# Patient Record
Sex: Male | Born: 1955 | Race: White | Hispanic: No | Marital: Married | Smoking: Former smoker
Health system: Southern US, Community
[De-identification: ages and names within clinical notes are randomized; demographics above are authoritative.]

## PROBLEM LIST (undated history)

## (undated) DIAGNOSIS — F32A Depression, unspecified: Secondary | ICD-10-CM

## (undated) DIAGNOSIS — M549 Dorsalgia, unspecified: Secondary | ICD-10-CM

## (undated) DIAGNOSIS — K259 Gastric ulcer, unspecified as acute or chronic, without hemorrhage or perforation: Secondary | ICD-10-CM

## (undated) DIAGNOSIS — Z8619 Personal history of other infectious and parasitic diseases: Secondary | ICD-10-CM

## (undated) DIAGNOSIS — E119 Type 2 diabetes mellitus without complications: Secondary | ICD-10-CM

## (undated) DIAGNOSIS — H269 Unspecified cataract: Secondary | ICD-10-CM

## (undated) DIAGNOSIS — I471 Supraventricular tachycardia, unspecified: Secondary | ICD-10-CM

## (undated) DIAGNOSIS — Z860100 Personal history of colon polyps, unspecified: Secondary | ICD-10-CM

## (undated) DIAGNOSIS — E785 Hyperlipidemia, unspecified: Secondary | ICD-10-CM

## (undated) DIAGNOSIS — J189 Pneumonia, unspecified organism: Secondary | ICD-10-CM

## (undated) DIAGNOSIS — I700 Atherosclerosis of aorta: Secondary | ICD-10-CM

## (undated) DIAGNOSIS — Z8739 Personal history of other diseases of the musculoskeletal system and connective tissue: Secondary | ICD-10-CM

## (undated) DIAGNOSIS — E669 Obesity, unspecified: Secondary | ICD-10-CM

## (undated) DIAGNOSIS — K5790 Diverticulosis of intestine, part unspecified, without perforation or abscess without bleeding: Secondary | ICD-10-CM

## (undated) DIAGNOSIS — M1990 Unspecified osteoarthritis, unspecified site: Secondary | ICD-10-CM

## (undated) DIAGNOSIS — Z8709 Personal history of other diseases of the respiratory system: Secondary | ICD-10-CM

## (undated) DIAGNOSIS — R0789 Other chest pain: Secondary | ICD-10-CM

## (undated) DIAGNOSIS — G5600 Carpal tunnel syndrome, unspecified upper limb: Secondary | ICD-10-CM

## (undated) DIAGNOSIS — G4730 Sleep apnea, unspecified: Secondary | ICD-10-CM

## (undated) DIAGNOSIS — I10 Essential (primary) hypertension: Secondary | ICD-10-CM

## (undated) DIAGNOSIS — G8929 Other chronic pain: Secondary | ICD-10-CM

## (undated) DIAGNOSIS — M25511 Pain in right shoulder: Secondary | ICD-10-CM

## (undated) DIAGNOSIS — F329 Major depressive disorder, single episode, unspecified: Secondary | ICD-10-CM

## (undated) HISTORY — PX: COLONOSCOPY: SHX174

## (undated) HISTORY — DX: Unspecified cataract: H26.9

## (undated) HISTORY — PX: ESOPHAGOGASTRODUODENOSCOPY: SHX1529

## (undated) HISTORY — PX: SHOULDER SURGERY: SHX246

## (undated) HISTORY — DX: Atherosclerosis of aorta: I70.0

## (undated) HISTORY — PX: OTHER SURGICAL HISTORY: SHX169

## (undated) HISTORY — DX: Obesity, unspecified: E66.9

## (undated) HISTORY — DX: Supraventricular tachycardia, unspecified: I47.10

## (undated) HISTORY — DX: Other chest pain: R07.89

## (undated) HISTORY — PX: BACK SURGERY: SHX140

## (undated) HISTORY — PX: APPENDECTOMY: SHX54

## (undated) HISTORY — DX: Pain in right shoulder: M25.511

---

## 1997-08-01 ENCOUNTER — Inpatient Hospital Stay (HOSPITAL_COMMUNITY): Admission: EM | Admit: 1997-08-01 | Discharge: 1997-08-03 | Payer: Self-pay | Admitting: Cardiology

## 1998-05-05 ENCOUNTER — Other Ambulatory Visit: Admission: RE | Admit: 1998-05-05 | Discharge: 1998-05-05 | Payer: Self-pay | Admitting: Family Medicine

## 2000-10-31 ENCOUNTER — Emergency Department (HOSPITAL_COMMUNITY): Admission: EM | Admit: 2000-10-31 | Discharge: 2000-10-31 | Payer: Self-pay | Admitting: Emergency Medicine

## 2000-11-20 ENCOUNTER — Emergency Department (HOSPITAL_COMMUNITY): Admission: EM | Admit: 2000-11-20 | Discharge: 2000-11-20 | Payer: Self-pay | Admitting: Internal Medicine

## 2001-01-12 ENCOUNTER — Emergency Department (HOSPITAL_COMMUNITY): Admission: EM | Admit: 2001-01-12 | Discharge: 2001-01-12 | Payer: Self-pay | Admitting: Emergency Medicine

## 2006-02-20 ENCOUNTER — Encounter (INDEPENDENT_AMBULATORY_CARE_PROVIDER_SITE_OTHER): Payer: Self-pay | Admitting: *Deleted

## 2006-02-20 ENCOUNTER — Observation Stay (HOSPITAL_COMMUNITY): Admission: EM | Admit: 2006-02-20 | Discharge: 2006-02-21 | Payer: Self-pay | Admitting: Emergency Medicine

## 2006-10-14 ENCOUNTER — Emergency Department (HOSPITAL_COMMUNITY): Admission: EM | Admit: 2006-10-14 | Discharge: 2006-10-14 | Payer: Self-pay | Admitting: Emergency Medicine

## 2008-02-13 ENCOUNTER — Ambulatory Visit (HOSPITAL_COMMUNITY): Admission: RE | Admit: 2008-02-13 | Discharge: 2008-02-13 | Payer: Self-pay | Admitting: Orthopedic Surgery

## 2008-02-25 ENCOUNTER — Ambulatory Visit (HOSPITAL_BASED_OUTPATIENT_CLINIC_OR_DEPARTMENT_OTHER): Admission: RE | Admit: 2008-02-25 | Discharge: 2008-02-25 | Payer: Self-pay | Admitting: Family Medicine

## 2008-02-28 ENCOUNTER — Ambulatory Visit: Payer: Self-pay | Admitting: Internal Medicine

## 2008-03-15 ENCOUNTER — Ambulatory Visit (HOSPITAL_BASED_OUTPATIENT_CLINIC_OR_DEPARTMENT_OTHER): Admission: RE | Admit: 2008-03-15 | Discharge: 2008-03-15 | Payer: Self-pay | Admitting: Orthopedic Surgery

## 2008-03-26 ENCOUNTER — Ambulatory Visit (HOSPITAL_BASED_OUTPATIENT_CLINIC_OR_DEPARTMENT_OTHER): Admission: RE | Admit: 2008-03-26 | Discharge: 2008-03-26 | Payer: Self-pay | Admitting: Family Medicine

## 2008-04-03 ENCOUNTER — Ambulatory Visit: Payer: Self-pay | Admitting: Internal Medicine

## 2010-03-25 DIAGNOSIS — J189 Pneumonia, unspecified organism: Secondary | ICD-10-CM

## 2010-03-25 HISTORY — DX: Pneumonia, unspecified organism: J18.9

## 2010-08-02 ENCOUNTER — Inpatient Hospital Stay (INDEPENDENT_AMBULATORY_CARE_PROVIDER_SITE_OTHER)
Admission: RE | Admit: 2010-08-02 | Discharge: 2010-08-02 | Disposition: A | Discharge status: Home / Self Care | Payer: BC Managed Care – PPO | Source: Ambulatory Visit | Attending: Family Medicine | Admitting: Family Medicine

## 2010-08-02 DIAGNOSIS — M79609 Pain in unspecified limb: Secondary | ICD-10-CM

## 2010-08-07 NOTE — Procedures (Signed)
NAME:  [name], [name]                     ACCOUNT NO.:  [id number]   MEDICAL RECORD NO.:  [id number]          PATIENT TYPE:  OUT   LOCATION:  SLEEP CENTER                 FACILITY:  [facility]   PHYSICIAN:  Clinton D. Maple Hudson, MD, FCCP, FACPDATE OF BIRTH:  Aug 08, 1955   DATE OF STUDY:  02/25/2008                            NOCTURNAL POLYSOMNOGRAM   REFERRING PHYSICIAN:  Windle Guard, M.D.   REFERRING PHYSICIAN:  Windle Guard, MD   INDICATION FOR STUDY:  Hypersomnia with sleep apnea.   EPWORTH SLEEPINESS SCORE:  13/24.  BMI 29.  Weight 210 pounds.  Height  71 inches.  Neck 17 inches.   HOME MEDICATIONS:  Charted and reviewed.   SLEEP ARCHITECTURE:  Total sleep time 323 minutes with sleep efficiency  83.2%.  Stage I was 7.1%.  Stage II 80.3%.  Stage III absent.  REM 12.5%  of total sleep time.  Sleep latency 32 minutes.  REM latency 307  minutes.  Awake after sleep onset 33 minutes.  Arousal index 29.7.  No  bedtime medication was taken.   RESPIRATORY DATA:  Apnea-hypopnea index (AHI) 11.5 per hour. A total of  62 events were scored including 7 obstructive apneas and 55 hypopneas.  Events were nonpositional.  REM AHI 53.3 per hour.  There were  insufficient early events to permit CPAP titration by split protocol on  the study night.   OXYGEN DATA:  Moderately loud snoring with oxygen desaturation to a  nadir of 75%.  Mean oxygen saturation through the study was 92.4% on  room air.   CARDIAC DATA:  Normal sinus rhythm.   MOVEMENT/PARASOMNIA:  Occasional limb jerks with a total of 11 counted,  of which 3 were associated with arousal or awakening for a periodic limb  movement with arousal index of 0.6 per hour.  Bathroom x1.   IMPRESSION/RECOMMENDATION:  1. Mild obstructive sleep apnea/hypopnea syndrome, apnea-hypopnea      index 11.5 per hour with nonpositional events, moderately loud      snoring, and oxygen desaturation to a nadir of 75%.  2. There were insufficient early events to  permit continuous positive      airway pressure titration by split protocol on the study night.      Scores in this range would be appropriate for continuous positive      airway pressure if more      conservative measures are not effective.  Consider return for      continuous positive airway pressure titration if indicated.      Clinton D. Maple Hudson, MD, [facility], FACP  Diplomate, Biomedical engineer of Sleep Medicine  Electronically Signed     CDY/MEDQ  D:  02/28/2008 15:18:00  T:  02/29/2008 01:09:09  Job:  [id number]

## 2010-08-07 NOTE — Op Note (Signed)
NAME:  [name], [name]                     ACCOUNT NO.:  [id number]   MEDICAL RECORD NO.:  [id number]          PATIENT TYPE:  AMB   LOCATION:  [facility]                         FACILITY:  [facility]   PHYSICIAN:  Madlyn Frankel. Charlann Boxer, M.D.  DATE OF BIRTH:  October 20, 1955   DATE OF PROCEDURE:  03/15/2008  DATE OF DISCHARGE:                               OPERATIVE REPORT   PREOPERATIVE DIAGNOSIS:  Left knee medial meniscal tear associated with  some minor degenerative changes.   POSTOPERATIVE DIAGNOSES/FINDINGS:  1. Complex tear into the posterior horn to midbody lateral meniscus      tearing.  2. 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  3. Central tearing to the anterior horn and mid body of the lateral      meniscus involving only about 10%.  4. A small area, 5-10 mm of grade II-III to minor changes over the      lateral facet of the patella.   PROCEDURE:  1. Left knee diagnostic and operative arthroscopy.  2. Medial and lateral partial meniscectomies.  3. Medial patellofemoral chondroplasty.   SURGEON:  Madlyn Frankel. Charlann Boxer, M.D.   ASSISTANT:  None.   ANESTHESIA:  General LMA plus locally administered portal block  initially, followed by intraarticular Marcaine at the end of the case.   FINDINGS:  As above.   SPECIMENS:  None.   COMPLICATIONS:  None.   BLOOD LOSS:  None.   INDICATIONS FOR PROCEDURE:  [name] is a 55 year old gentleman who  presented office for left knee injury.  He had medial joint line  symptoms.  MRI had confirmed concerns for meniscal pathology.  He had  failed conservative measures and wished to proceed with arthroscopic  surgery.  The risks of infection, persistence of symptoms, progressive  arthritis, in addition to DVT were all discussed.  Consent was obtained  for the benefit of pain relief.   PROCEDURE IN DETAIL:  The patient was brought to the  operative theater.  Once adequate anesthesia and preoperative antibiotics, Ancef 2 grams  administered, the patient was positioned supine with the left leg in a  leg holder.  A timeout was performed, identifying the patient, extremity  and procedure.   The left lower extremity was then prepped and draped in a sterile  fashion.  Standard inferolateral, inferomedial and superomedial portals  were utilized.  Diagnostic evaluation of the knee revealed the above  findings.  Probe examination to the inferomedial portal evaluated the  stability of the meniscus.  I used a combination of straight biting  basket and a 3.5 Cuda shaver through the inferomedial portal for  debridement of the medial meniscus back to a stable level.  It was a  fairly complex tear.  There was a portion of superior meniscus that was  intact and remained intact.  There was significant degenerative changes  to the inferior portion in a horizontal cleavage tear pattern.  I did  probe examine  the remaining meniscus following this debridement to make  sure that it was stable, and there were no unstable portions or nothing  that migrated to the joint.   Also noted on the distal weightbearing surface of the femoral condyle  was this chondral flap, probably associated meniscal pathology.  The 3.5  shaver was utilized for debridement of this back to stable levels.   The lateral compartment was examined, first examining the integrity of  the ACL which was intact.  The lateral compartment had small tearing to  the central portion of the lateral meniscus.  I used a biting basket and  then a 3.5 Cuda shaver to remove these fragments.  The remaining  meniscus and cartilage were stable.  Anteriorly, a small amount of  cartilage was debrided off the lateral facet.  There was no significant  trochlear, medial facet or apex degenerative changes noted.  I  reexamined the knee to make sure there was no other loose fragments of   cartilage.  Once I was confident that there was not, the instrumentation  was removed.  The portal sites were reapproximated using a 4-0 nylon.  I  injected the knee at the end of the case with quarter-percent Marcaine  with epinephrine, a total of 30 mL.  The knee was then dressed in a  sterile bulky Jones dressing.  He was brought to the recovery room in  stable condition tolerating the procedure well.   I will see him back in routine follow-up in 10-12 days.  Discharge  instructions and medications prescribed.      Madlyn Frankel Charlann Boxer, M.D.  Electronically Signed     MDO/MEDQ  D:  03/15/2008  T:  03/15/2008  Job:  [id number]

## 2010-08-10 NOTE — Procedures (Signed)
NAME:  [name], [name]                     ACCOUNT NO.:  [id number]   MEDICAL RECORD NO.:  [id number]          PATIENT TYPE:  OUT   LOCATION:  SLEEP CENTER                 [facility]ACILITY:  [facility]   PHYSICIAN:  Clinton D. Maple Hudson, MD, FCCP, FACPDATE OF BIRTH:  07[birth year]13-1955   DATE OF STUDY:                            NOCTURNAL POLYSOMNOGRAM   REFERRING PHYSICIAN:   INDICATION FOR STUDY:  Hypersomnia with sleep apnea.   EPWORTH SLEEPINESS SCORE:  Epworth sleepiness score 14/24.  BMI 29.3.  Weight 210 pounds.  Height 71 inches.  Neck 17 inches.   MEDICATIONS:  Home medications are charted and reviewed.  A baseline  diagnostic study on February 25, 2008, had recorded an AHI of 11.5 per  hour.  CPAP titration is requested.   SLEEP ARCHITECTURE:  Total sleep time 277 minutes with sleep efficiency  61.1%.  Stage I was 6.5%.  Stage II 61.7%.  Stage III absent.  REM 31.8%  of total sleep time.  Sleep latency 27 minutes.  REM latency 76 minutes.  Wake after sleep onset 149 minutes.  Arousal index 25.6.  No bedtime  medication was taken.   RESPIRATORY DATA:  CPAP titration protocol.  CPAP was titrated to 14  CWP, AHI 0.7 per hour.  He chose a large ResMed Mirage Quattro mask with  heated humidifier.   CARDIAC DATA:  Normal sinus rhythm.   MOVEMENT/PARASOMNIA:  Occasional limb jerks with arousal typical of CPAP  titration.  A total of 7 events with arousal was counted for an index of  1.5 per hour.   IMPRESSIONS/RECOMMENDATIONS:  1. Successful continuous positive airway pressure titration to 14      centimeters of water pressure, apnea-hypopnea index 0.7 per hour.      He chose a large ResMed Mirage Quattro mask with heated humidifier.  2. Baseline diagnostic nocturnal polysomnogram on February 25, 2008,      had recorded an apnea-hypopnea index of 11.5 per hour.      Clinton D. Maple Hudson, MD, [facility], FACP  Diplomate, Biomedical engineer of Sleep Medicine  Electronically Signed    CDY/MEDQ  D:  04/02/2008  10:36:14  T:  04/02/2008 23:53:51  Job:  [id number]

## 2010-08-10 NOTE — Consult Note (Signed)
NAME:  [name], [name] NO.:  [id number]   MEDICAL RECORD NO.:  [id number]          PATIENT TYPE:  EMS   LOCATION:  ED                           FACILITY:  [facility]   PHYSICIAN:  Ardeth Sportsman, MD     DATE OF BIRTH:  23-Jan-1956   DATE OF CONSULTATION:  DATE OF DISCHARGE:                                 CONSULTATION   PRIMARY CARE PHYSICIAN:  Windle Guard, M.D.   SURGEON:  Ardeth Sportsman, M.D.   REASON FOR CONSULTATION:  Right side abdominal pain, possible  appendicitis.   HISTORY OF PRESENT ILLNESS:  [name] is a 55 year old male who is rather  healthy and pretty physically active who has had about a 4-day history  of right sided abdominal pain that has been persistent with a feeling of  ickiness.  He has had some mild nausea, but he has been able to  tolerate most p.o.  He normally has a bowel movement about every day  with no bad bouts of constipation or diarrhea.  No sick contacts or  travel history.  He had some subjective fevers, especially last night.  Because his symptoms had not improved and actually his pain seems to be  worsening and more focal in the right lower quadrant, he and his wife  went to see his primary care physician who is concerned for an  appendicitis.  Dr. Marcy Panning was consulted and recommendation made to  come to the emergency room for evaluation and surgical consultation by  our group.   The patient did not have a history of inflammatory bowel disease or  irritable bowel syndrome.  No history of any other GI problems.  He has  never had a colonoscopy.   PAST MEDICAL HISTORY:  1. Hypertension.  2. Diverticulosis by CT scan today.  3. He has some chronic back pain.   PAST SURGICAL HISTORY:  Negative.   ALLERGIES:  NONE.   MEDICATIONS:  He takes hydrochlorothiazide daily and question of  Vicodin.   SOCIAL HISTORY:  It sounds like he has an 80-pack-year history of  tobacco, smoking up to 3 packs per day but he quit a year  ago.  He  occasionally has some alcohol, a couple of times a week but no other  drug use.  He is married and his wife is here at the bedside.  He works  in [employer] and does a fair amount of heavy lifting and heavy duty  activity.   FAMILY HISTORY:  Negative for any inflammatory bowel disease or colon  cancer or any other major GI disorders.   REVIEW OF SYSTEMS:  Constitutional, ophthalmic, ENT, cardiac,  respiratory, GU, dermatologic, hematologic, lymphatic, allergic are all  negative.  GI:  As noted above.  No hematochezia or melena.  PSYCHIATRIC:  Negative for any dementia, delirium, psychosis, paranoia.   PHYSICAL EXAMINATION:  VITAL SIGNS:  His temperature is 97, pulse was  102 and currently 95, respirations 16.  His pain was a 4 out of 10 on  arrival and it persists in that.  Blood pressure is 169/108 on arrival.  GENERAL:  He is a well developed, slightly overweight male,  uncomfortable but not frankly toxic.  PSYCHIATRIC:  He is pleasant, interactive, with at least average  intelligence.  No evidence of any dementia, delirium, psychosis, or  paranoia.  EYES:  Pupils are equal, round, and reactive to light.  Extraocular  movements are intact.  His sclerae are nonicteric or injected.  HEENT:  He is normocephalic with no facial asymmetry.  Mucous membranes  are moist.  Nasopharynx and oropharynx are clear.  NECK:  Supple without any masses.  Trachea is midline.  Thyroid appears  normal without any obvious masses.  CHEST:  No pain on rib or sternal compression.  LUNGS:  Clear to auscultation bilaterally.  No wheezes, rales, or  rhonchi.  HEART:  Regular rate and rhythm.  No murmurs, clicks, or rubs.  No  carotid bruits.  He has normal radial and __________ pulses.  BACK:  He has some mild tenderness in his mid thoracic back but no  obvious step off.  Lower back seems benign.  No significant costophrenic  angle tenderness.  Pretty good range of motion in the cervical,   thoracic, and lumbar spine.  MUSCULOSKELETAL:  Full range of motion shoulders, elbows, wrists, as  well as hips, knees, and ankles.  SKIN:  No obvious petechiae or purpura.  No target lesions.  No sores.  LYMPH:  No head, neck, axillary, groin lymphadenopathy.  ABDOMEN:  Obese but soft.  Not distended.  No umbilical hernias.  He  does have tenderness in the right lower quadrant, especially over  McBurney's point.  He does have a little bit of pain to cough and deep  percussion.  He did not have any diffuse peritonitis.  GENITAL:  Normal male external genitalia.  Circumcised with testes  descended bilaterally.  No inguinal hernias.  RECTAL:  Deferred per patient request.   STUDIES:  He has a white count of 8.8 with no left shift.  His liver  function tests are all pretty normal except for total bilirubin of 1.3,  high normal range is 1.4.  EKG is normal with no ST-T changes and normal  sinus rhythm.  Chest x-ray is clear with no evidence of any  cardiopulmonary disease.  CT scan of the abdomen and pelvis, notes no  abdominal wall hernias or inguinal hernias.  He has no bowel  obstruction.  He does have some inflammation at the base of his appendix  but no major stranding and no obvious inflation of the tip, although he  has a thin appendix.  He does have diverticulosis in the sigmoid and the  sigmoid does reflect over towards the right side but there is no  evidence of any diverticulitis.  There is no stranding or free fluid.  Of note, his terminal ileum is not inflamed.  He has no portal  lymphadenopathy or mesentery lymphadenopathy.  His gallbladder appears  to be normal.  He may have some steatohepatitis, otherwise his liver is  unremarkable.   ASSESSMENT/PLAN:  A 54 year old male with subjective fevers and CT scan  findings concerning for possible appendicitis with no other etiology  feasible.  The anatomy and physiology of the digestive tract was explained.  Pathophysiology of  appendicitis was explained in conjunction with the  differential diagnosis.  Recommendation was made for diagnostic  laparoscopy with appendectomy.  Risks such as stroke, MI, DVT, pulmonary  embolism and death were discussed, that was deep vein  thrombosis.  Risks such as bleeding, hematoma, need for transfusion,  wound infection, abscess, injury to other organs and an incorrect  diagnosis, bile leak, need for re-operation other risks were discussed.  He and his wife had their questions answered and they agreed to proceed.      Ardeth Sportsman, MD  Electronically Signed     SCG/MEDQ  D:  02/20/2006  T:  02/21/2006  Job:  [id number]

## 2010-08-10 NOTE — Op Note (Signed)
NAME:  [name], [name]                     ACCOUNT NO.:  [id number]   MEDICAL RECORD NO.:  [id number]          PATIENT TYPE:  INP   LOCATION:  1191                         FACILITY:  [facility]   PHYSICIAN:  Ardeth Sportsman, MD     DATE OF BIRTH:  23-Sep-1955   DATE OF PROCEDURE:  02/20/2006  DATE OF DISCHARGE:                               OPERATIVE REPORT   SURGEON:  Ardeth Sportsman, M.D.   ASSISTANT:  None.   PREOPERATIVE DIAGNOSIS:  Appendicitis.   POSTOPERATIVE DIAGNOSIS:  Acute non-perforated appendicitis.   PROCEDURE PERFORMED:  Diagnostic laparoscopy with appendectomy.   ANESTHESIA:  1. General.  2. Local anesthetic in a field block around all port sites.   SPECIMENS:  Appendix.   DRAINS:  None.   ESTIMATED BLOOD LOSS:  Minimal (less than 5 mL).   COMPLICATIONS:  None apparent.   INDICATIONS FOR PROCEDURE:  Mr. Fennell is a 55 year old gentleman with a 96  hour history of abdominal pain directly focused in the right lower  quadrant with anorexia and nausea.  His symptoms progressed to the point  where her saw his primary care physician, Dr. Jeannetta Nap, who was concerned  about appendicitis.  He was brought to the emergency room  and  evaluation showed physical exam and CT scan suggestive of appendicitis.  The anatomy and physiology of the digestive tract was discussed.  The  pathophysiology of appendicitis was explained.  The options were  discussed and a recommendation was made for diagnostic laparoscopy with  appendectomy.  The risks such as stroke, heart attack, deep venous  thrombosis, pulmonary embolism, and death were discussed.  The risks  such as bleeding, need for transfusion, wound infection, abscess, injury  to other organs, leak, hernia, and other risks were discussed.  Questions were answered and he and his wife wished to proceed.   OPERATIVE FINDINGS:  He had an early appendicitis with an inflamed wall,  but no obvious separation, abscess, or perforation.   DESCRIPTION OF PROCEDURE:  Informed consent was confirmed.  The patient  was given IV Rocephin despite my orders of IV Cefoxitin in the ER.  He  was given an additional 500 mg of Flagyl.  He had sequential compression  devices at the time of general anesthesia which he tolerated well.  A  Foley catheter was placed and the patient was positioned supine with  both arms tucked.  His abdomen was prepped and draped in a sterile  fashion.  Entry was gained to the abdomen using a Veress technique.  The  Veress passed into the abdomen on the first attempt through a  supraumbilical vertical incision using the towel clamp to hold the  umbilical stalk for fascial and counter traction.  Pneumoperitoneum to  15 mmHg provided good abdominal insufflation.  A 5 mm port was placed.  Camera inspection revealed no intra-abdominal injury.  Under direct  visualization, a 5 mm port was placed in the right upper quadrant region  and a 12 mm port was placed in the left lower quadrant.   Diagnostic laparoscopy was performed.  The cecum could easily be found  and was able to be rotated medially to find the appendix posteriorly.  The appendix seemed stiffened, but no obvious separation or abscess.  The small bowel was followed proximally and no Meckel's diverticulum was  noted and there was no evidence of any mesentery lymphadenopathy and the  ileum appeared to be normal size, as well.  The colon was followed  cephalad and appeared to be normal, as well.  The sigmoid did not show  any evidence of any inflammation.  Given the diagnosis of appendicitis,  I thought it was reasonable that his appendix be removed.   A window was made in the base of the appendix and the appendiceal  mesentery was ligated and transected using harmonic ultrasonic  dissection.  The base of the appendix was transected using a  laparoscopic stapler.  The appendix was removed inside a bag and brought  out the 12 mm port intact.  The fascial  defect of the 12 mm port was  reapproximated using a 0 Vicryl stitch using a laparoscopic suture  passer.  Copious irrigation was performed, a nice clear return, the  staple line was inspected noting the staple was nice and intact with no  evidence of any dehiscence.  There was no bleeding.  The 12 and right  upper quadrant ports were removed, no evidence of any bleeding.  The  pneumoperitoneum was completely evacuated.  The umbilical port was  removed.  The fascial stitch was tied down.  The skin was closed using 4-  0 Monocryl stitch.  A sterile dressing was applied.  The patient was  extubated and sent to the recovery room in stable condition.   I explained the operative findings to the patient's wife.      Ardeth Sportsman, MD  Electronically Signed     SCG/MEDQ  D:  02/20/2006  T:  02/21/2006  Job:  [id number]

## 2010-08-23 IMAGING — CR DG ORBITS FOR FOREIGN BODY
2 series · 2 of 2 positions shown · non-contrast
Comparison: None available.

CLINICAL DATA: The patient scheduled for MRI.  History of working
with sheet metal.

ORBITS FOR FOREIGN BODY - 2 VIEW

[w waters (1 of 2)]
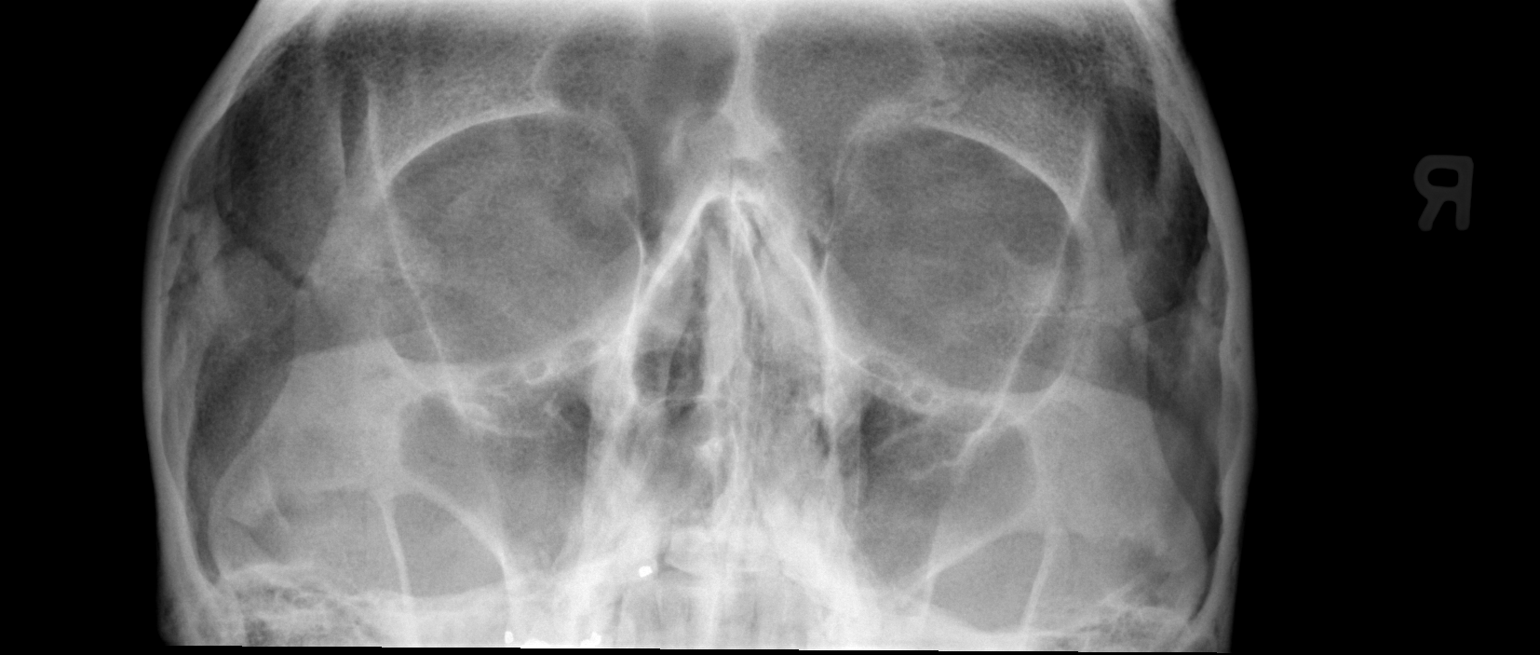

[w waters (2 of 2)]
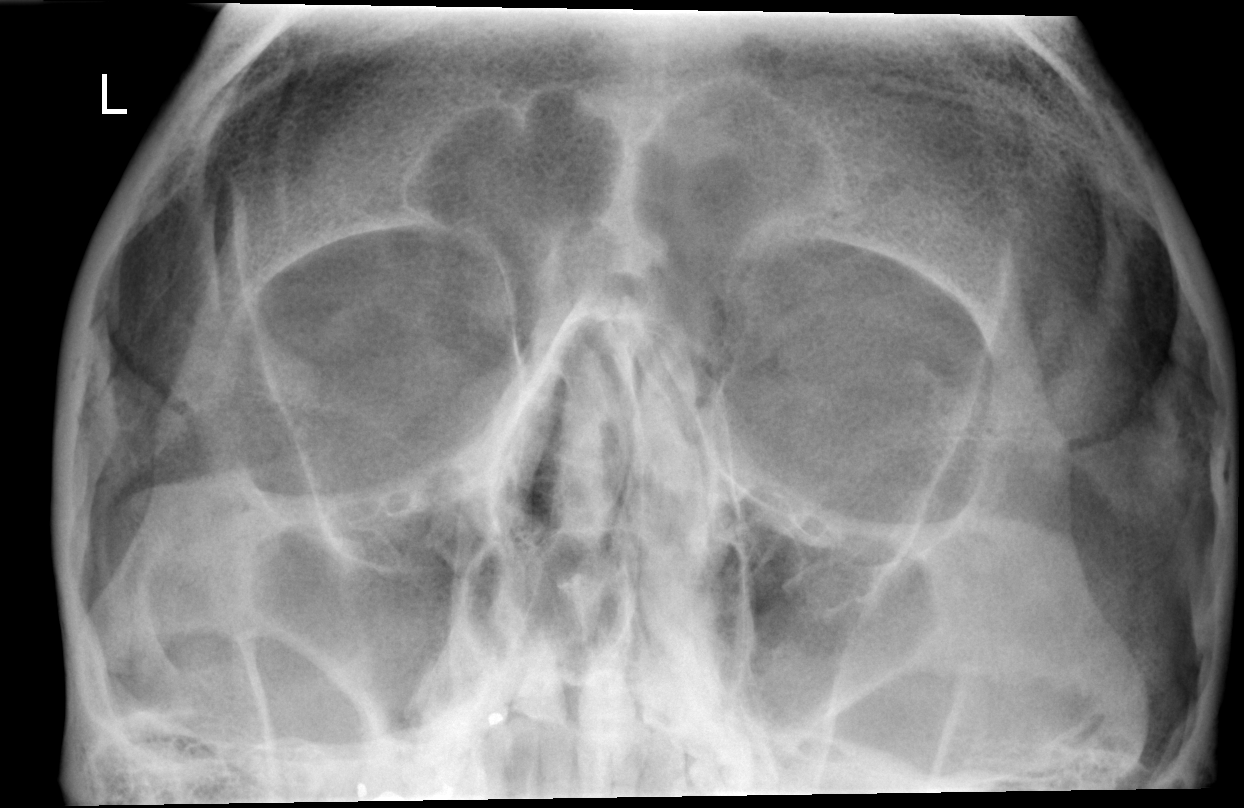

[2 of 2 positions shown; findings below may reference images not displayed]

FINDINGS: No radiopaque foreign bodies projected over the orbits.
Dental amalgam is noted.  The paranasal sinuses appear clear.
IMPRESSION: No radiopaque foreign body projected over the orbits.

## 2010-12-28 LAB — BASIC METABOLIC PANEL
Calcium: 9.5 mg/dL (ref 8.4–10.5)
GFR calc Af Amer: >60 mL/min (ref >60)
GFR calc non Af Amer: >60 mL/min (ref >60)
Potassium: 4.8 mEq/L (ref 3.5–5.1)
Sodium: 137 mEq/L (ref 135–145)

## 2010-12-28 LAB — CBC
Hemoglobin: 18 g/dL — ABNORMAL HIGH (ref 13.0–17.0)
RBC: 5.27 MIL/uL (ref 4.22–5.81)
RDW: 13.3 % (ref 11.5–15.5)
WBC: 7.6 10*3/uL (ref 4.0–10.5)

## 2010-12-28 LAB — URINALYSIS, ROUTINE W REFLEX MICROSCOPIC
Nitrite: NEGATIVE
pH: 6 (ref 5.0–8.0)

## 2010-12-28 LAB — DIFFERENTIAL
Basophils Absolute: 0 10*3/uL (ref 0.0–0.1)
Lymphocytes Relative: 35 % (ref 12–46)
Lymphs Abs: 2.7 10*3/uL (ref 0.7–4.0)
Monocytes Absolute: 0.6 10*3/uL (ref 0.1–1.0)
Monocytes Relative: 8 % (ref 3–12)
Neutro Abs: 4.1 10*3/uL (ref 1.7–7.7)

## 2010-12-28 LAB — APTT: aPTT: 32 seconds (ref 24–37)

## 2011-01-07 LAB — DIFFERENTIAL
Eosinophils Absolute: 0.2
Lymphocytes Relative: 28
Lymphs Abs: 3.6 — ABNORMAL HIGH
Neutrophils Relative %: 64

## 2011-01-07 LAB — URINALYSIS, ROUTINE W REFLEX MICROSCOPIC
Hgb urine dipstick: NEGATIVE
Protein, ur: NEGATIVE
Urobilinogen, UA: 1

## 2011-01-07 LAB — CBC
Hemoglobin: 14.8
MCHC: 35.2
MCV: 97.1
RBC: 4.34

## 2011-01-07 LAB — COMPREHENSIVE METABOLIC PANEL
CO2: 26
Calcium: 8.9
Creatinine, Ser: 0.84
GFR calc non Af Amer: >60
Glucose, Bld: 120 — ABNORMAL HIGH

## 2011-01-07 LAB — URINE MICROSCOPIC-ADD ON

## 2011-06-12 ENCOUNTER — Other Ambulatory Visit: Payer: Self-pay | Admitting: Family Medicine

## 2011-06-12 DIAGNOSIS — E291 Testicular hypofunction: Secondary | ICD-10-CM

## 2011-06-15 ENCOUNTER — Ambulatory Visit
Admission: RE | Admit: 2011-06-15 | Discharge: 2011-06-15 | Disposition: A | Discharge status: Home / Self Care | Payer: BC Managed Care – PPO | Source: Ambulatory Visit | Attending: Family Medicine | Admitting: Family Medicine

## 2011-06-15 DIAGNOSIS — E291 Testicular hypofunction: Secondary | ICD-10-CM

## 2011-06-15 MED ORDER — GADOBENATE DIMEGLUMINE 529 MG/ML IV SOLN
9.0000 mL | Freq: Once | INTRAVENOUS | Status: AC | PRN
  Administered 2011-06-15: 9 mL via INTRAVENOUS

## 2012-10-20 ENCOUNTER — Other Ambulatory Visit (HOSPITAL_COMMUNITY): Payer: Self-pay | Admitting: Orthopedic Surgery

## 2012-10-20 ENCOUNTER — Ambulatory Visit (HOSPITAL_COMMUNITY)
Admission: RE | Admit: 2012-10-20 | Discharge: 2012-10-20 | Disposition: A | Discharge status: Home / Self Care | Payer: Worker's Compensation | Source: Ambulatory Visit | Attending: Orthopedic Surgery | Admitting: Orthopedic Surgery

## 2012-10-20 DIAGNOSIS — R52 Pain, unspecified: Secondary | ICD-10-CM

## 2012-10-20 DIAGNOSIS — Z1389 Encounter for screening for other disorder: Secondary | ICD-10-CM | POA: Insufficient documentation

## 2012-11-20 ENCOUNTER — Encounter (HOSPITAL_COMMUNITY): Payer: Self-pay | Admitting: Pharmacy Technician

## 2012-11-24 ENCOUNTER — Encounter (HOSPITAL_COMMUNITY): Payer: Self-pay

## 2012-11-24 ENCOUNTER — Encounter (HOSPITAL_COMMUNITY)
Admission: RE | Admit: 2012-11-24 | Discharge: 2012-11-24 | Disposition: A | Discharge status: Home / Self Care | Payer: Worker's Compensation | Source: Ambulatory Visit | Attending: Orthopedic Surgery | Admitting: Orthopedic Surgery

## 2012-11-24 ENCOUNTER — Encounter (HOSPITAL_COMMUNITY)
Admission: RE | Admit: 2012-11-24 | Discharge: 2012-11-24 | Disposition: A | Discharge status: Home / Self Care | Payer: BC Managed Care – PPO | Source: Ambulatory Visit | Attending: Orthopedic Surgery | Admitting: Orthopedic Surgery

## 2012-11-24 DIAGNOSIS — Z01818 Encounter for other preprocedural examination: Secondary | ICD-10-CM | POA: Insufficient documentation

## 2012-11-24 HISTORY — DX: Major depressive disorder, single episode, unspecified: F32.9

## 2012-11-24 HISTORY — DX: Type 2 diabetes mellitus without complications: E11.9

## 2012-11-24 HISTORY — DX: Essential (primary) hypertension: I10

## 2012-11-24 HISTORY — DX: Personal history of colon polyps, unspecified: Z86.0100

## 2012-11-24 HISTORY — DX: Hyperlipidemia, unspecified: E78.5

## 2012-11-24 HISTORY — DX: Personal history of other diseases of the musculoskeletal system and connective tissue: Z87.39

## 2012-11-24 HISTORY — DX: Personal history of colonic polyps: Z86.010

## 2012-11-24 HISTORY — DX: Personal history of other infectious and parasitic diseases: Z86.19

## 2012-11-24 HISTORY — DX: Other chronic pain: M54.9

## 2012-11-24 HISTORY — DX: Personal history of other diseases of the respiratory system: Z87.09

## 2012-11-24 HISTORY — DX: Carpal tunnel syndrome, unspecified upper limb: G56.00

## 2012-11-24 HISTORY — DX: Other chronic pain: G89.29

## 2012-11-24 HISTORY — DX: Diverticulosis of intestine, part unspecified, without perforation or abscess without bleeding: K57.90

## 2012-11-24 HISTORY — DX: Depression, unspecified: F32.A

## 2012-11-24 HISTORY — DX: Unspecified osteoarthritis, unspecified site: M19.90

## 2012-11-24 HISTORY — DX: Pneumonia, unspecified organism: J18.9

## 2012-11-24 HISTORY — DX: Gastric ulcer, unspecified as acute or chronic, without hemorrhage or perforation: K25.9

## 2012-11-24 HISTORY — DX: Sleep apnea, unspecified: G47.30

## 2012-11-24 LAB — CBC
Hemoglobin: 18.4 g/dL — ABNORMAL HIGH (ref 13.0–17.0)
MCH: 33.9 pg (ref 26.0–34.0)
MCHC: 35.5 g/dL (ref 30.0–36.0)
Platelets: 220 10*3/uL (ref 150–400)
RDW: 13.2 % (ref 11.5–15.5)

## 2012-11-24 LAB — BASIC METABOLIC PANEL
Calcium: 10.2 mg/dL (ref 8.4–10.5)
GFR calc Af Amer: >90 mL/min (ref >90)
GFR calc non Af Amer: 82 mL/min — ABNORMAL LOW (ref >90)
Potassium: 4.3 mEq/L (ref 3.5–5.1)
Sodium: 137 mEq/L (ref 135–145)

## 2012-11-24 LAB — SURGICAL PCR SCREEN
MRSA, PCR: NEGATIVE
Staphylococcus aureus: NEGATIVE

## 2012-11-24 MED ORDER — CEFAZOLIN SODIUM-DEXTROSE 2-3 GM-% IV SOLR
2.0000 g | INTRAVENOUS | Status: AC
  Administered 2012-11-25: 2 g via INTRAVENOUS
  Filled 2012-11-24: qty 50

## 2012-11-24 NOTE — Pre-Procedure Instructions (Signed)
[name]  11/24/2012   Your procedure is scheduled on:  Wednesday, September 3rd  Report to [facility] at 1100 AM.  Call this number if you have problems the morning of surgery: [phone]   Remember:   Do not eat food or drink liquids after midnight.   Take these medicines the morning of surgery with A SIP OF WATER: paxil, hydrocodone if needed   Do not wear jewelry.  Do not wear lotions, powders, or perfumes. You may wear deodorant.  Do not shave 48 hours prior to surgery. Men may shave face and neck.  Do not bring valuables to the hospital.  [facility] is not responsible   for any belongings or valuables.  Contacts, dentures or bridgework may not be worn into surgery.  Leave suitcase in the car. After surgery it may be brought to your room.  For patients admitted to the hospital, checkout time is 11:00 AM the day of  discharge.   Patients discharged the day of surgery will not be allowed to drive home.   Special Instructions: Shower using CHG 2 nights before surgery and the night before surgery.  If you shower the day of surgery use CHG.  Use special wash - you have one bottle of CHG for all showers.  You should use approximately 1/3 of the bottle for each shower.   Please read over the following fact sheets that you were given: Pain Booklet, Coughing and Deep Breathing, MRSA Information and Surgical Site Infection Prevention

## 2012-11-24 NOTE — H&P (Signed)
  ory of Present Illness The patient is a 57 year old male who presents today for follow up of their neck. The patient is being followed for their right-sided neck pain. Symptoms reported today include: pain, pain at night and stiffness (more stiffness in neck since injection from 09/23/2012, has been getting tension headaches since injection). The patient feels that they are doing well (eased patients right arm pain since injection) and report their pain level to be mild (can get more moderate later in the day ) and 2 in the AM, 8 in the PM / 10. The following medication has been used for pain control: Hydrocodone (for arthritis, takes 4 times a day). The patient presents today following MRI (done on 10/21/2012 at [facility]).    Subjective Transcription  He returns today for follow up. He actually had some temporary relief with the C6 selective nerve root block.    Allergies Tetracycline HCl *Tetracyclines**   Social History Alcohol use. current drinker; drinks wine; only occasionally per week Children. 4 Current work status. working full time Drug/Alcohol Rehab (Currently). no Drug/Alcohol Rehab (Previously). no Exercise. Exercises never Illicit drug use. no Living situation. live with spouse Marital status. married Number of flights of stairs before winded. greater than 5 Pain Contract. no Tobacco / smoke exposure. yes outdoors only Tobacco use. former smoker; smoke(d) 2 pack(s) per day   Medication History Norco ( Oral) Specific dose unknown - Active. Pravastatin Sodium ( Oral) Specific dose unknown - Active. Lisinopril ( Oral) Specific dose unknown - Active. Paxil ( Oral) Specific dose unknown - Active. Medications Reconciled.   Objective Transcription  He continues to have significant neck pain, radiation into the C5 and C6 dermatome. He has numbness in the C5 and C6 dermatome. He has trace weakness of the deltoid and biceps on the right side. He  has no shortness of breath or chest pain. The abdomen is soft, nontender. Normal gait pattern. Negative Hoffmann sign. Symmetrical 1+ deep tendon reflexes. Negative Babinski test. No clonus.    Plans Transcription  At this point in time the patient's clinical exam is positive for right C5-C6 radiculopathy. Although the C6 radicular pain is the prominent source of his discomfort I am concerned about the C4-5 level. Clinically he has numbness in his distribution and there is disease at that level. While there are some changes at the C6-7 level it is not as pronounced as the other two. At this point my recommendation having had injection therapy, physical therapy and symptoms greater than a year, would be a two level anterior cervical discectomy and fusion. We reviewed the risks which include infection, bleeding, nerve damage, death, stroke, paralysis, failure to heal, ongoing or worse pain, throat pain, swallowing difficulties, hoarseness in the voice, nonunion, need for posterior supplemental fusion. All questions were encouraged. We have gone over the procedure which is a two level C4-5, C5-6 ACDF and we will need a postoperative external bone stimulator given the fact this is a multilevel procedure. We will plan on proceeding once we have insurance approval and PCP clearance.

## 2012-11-24 NOTE — Progress Notes (Signed)
Saw a cardiologist while in the hospital about 14yrs ago what turned out to be heat exhaustion   Echo and stress test done about 75yrs  Denies ever having a heart cath   Dr.Elkins in [place] is Medical MD  EKG and CXR to be requested from Dr.Elkins

## 2012-11-24 NOTE — Progress Notes (Signed)
Per pt he has a high iron level

## 2012-11-25 ENCOUNTER — Encounter (HOSPITAL_COMMUNITY): Payer: Self-pay | Admitting: Anesthesiology

## 2012-11-25 ENCOUNTER — Observation Stay (HOSPITAL_COMMUNITY): Payer: Worker's Compensation

## 2012-11-25 ENCOUNTER — Ambulatory Visit (HOSPITAL_COMMUNITY): Payer: Worker's Compensation | Admitting: Anesthesiology

## 2012-11-25 ENCOUNTER — Encounter (HOSPITAL_COMMUNITY)
Admission: RE | Disposition: A | Discharge status: Home / Self Care | Payer: Self-pay | Source: Ambulatory Visit | Attending: Orthopedic Surgery

## 2012-11-25 ENCOUNTER — Observation Stay (HOSPITAL_COMMUNITY)
Admission: RE | Admit: 2012-11-25 | Discharge: 2012-11-26 | Disposition: A | Discharge status: Home / Self Care | Payer: Worker's Compensation | Source: Ambulatory Visit | Attending: Orthopedic Surgery | Admitting: Orthopedic Surgery

## 2012-11-25 DIAGNOSIS — Z860100 Personal history of colon polyps, unspecified: Secondary | ICD-10-CM | POA: Insufficient documentation

## 2012-11-25 DIAGNOSIS — E119 Type 2 diabetes mellitus without complications: Secondary | ICD-10-CM | POA: Insufficient documentation

## 2012-11-25 DIAGNOSIS — Z6835 Body mass index (BMI) 35.0-35.9, adult: Secondary | ICD-10-CM | POA: Insufficient documentation

## 2012-11-25 DIAGNOSIS — M47812 Spondylosis without myelopathy or radiculopathy, cervical region: Principal | ICD-10-CM | POA: Insufficient documentation

## 2012-11-25 DIAGNOSIS — I10 Essential (primary) hypertension: Secondary | ICD-10-CM | POA: Insufficient documentation

## 2012-11-25 DIAGNOSIS — Z981 Arthrodesis status: Secondary | ICD-10-CM

## 2012-11-25 DIAGNOSIS — F17200 Nicotine dependence, unspecified, uncomplicated: Secondary | ICD-10-CM | POA: Insufficient documentation

## 2012-11-25 HISTORY — PX: ANTERIOR CERVICAL DECOMP/DISCECTOMY FUSION: SHX1161

## 2012-11-25 LAB — GLUCOSE, CAPILLARY
Glucose-Capillary: 126 mg/dL — ABNORMAL HIGH (ref 70–99)
Glucose-Capillary: 125 mg/dL — ABNORMAL HIGH (ref 70–99)

## 2012-11-25 SURGERY — ANTERIOR CERVICAL DECOMPRESSION/DISCECTOMY FUSION 2 LEVEL/HARDWARE REMOVAL
Anesthesia: General | Site: Neck | Wound class: Clean

## 2012-11-25 MED ORDER — HYDROMORPHONE HCL PF 1 MG/ML IJ SOLN
0.2500 mg | INTRAMUSCULAR | Status: DC | PRN
  Administered 2012-11-25 (×4): 0.5 mg via INTRAVENOUS

## 2012-11-25 MED ORDER — ACETAMINOPHEN 10 MG/ML IV SOLN
INTRAVENOUS | Status: AC
  Filled 2012-11-25: qty 100

## 2012-11-25 MED ORDER — THROMBIN 20000 UNITS EX KIT
PACK | CUTANEOUS | Status: DC | PRN
  Administered 2012-11-25: 18:00:00 via TOPICAL

## 2012-11-25 MED ORDER — PAROXETINE HCL 20 MG PO TABS
20.0000 mg | ORAL_TABLET | Freq: Every day | ORAL | Status: DC
  Administered 2012-11-25 – 2012-11-26 (×2): 20 mg via ORAL
  Filled 2012-11-25 (×2): qty 1

## 2012-11-25 MED ORDER — PHENOL 1.4 % MT LIQD: 1.0000 | OROMUCOSAL | Status: DC | PRN

## 2012-11-25 MED ORDER — OXYCODONE HCL 5 MG PO TABS
ORAL_TABLET | ORAL | Status: AC
  Filled 2012-11-25: qty 1

## 2012-11-25 MED ORDER — SODIUM CHLORIDE 0.9 % IJ SOLN: 3.0000 mL | INTRAMUSCULAR | Status: DC | PRN

## 2012-11-25 MED ORDER — FENTANYL CITRATE 0.05 MG/ML IJ SOLN
INTRAMUSCULAR | Status: DC | PRN
  Administered 2012-11-25 (×5): 50 ug via INTRAVENOUS

## 2012-11-25 MED ORDER — THROMBIN 20000 UNITS EX SOLR
CUTANEOUS | Status: AC
  Filled 2012-11-25: qty 20000

## 2012-11-25 MED ORDER — VECURONIUM BROMIDE 10 MG IV SOLR
INTRAVENOUS | Status: DC | PRN
  Administered 2012-11-25 (×3): 2 mg via INTRAVENOUS
  Administered 2012-11-25: 1 mg via INTRAVENOUS

## 2012-11-25 MED ORDER — BUPIVACAINE-EPINEPHRINE 0.25% -1:200000 IJ SOLN
INTRAMUSCULAR | Status: DC | PRN
  Administered 2012-11-25: 6 mL

## 2012-11-25 MED ORDER — 0.9 % SODIUM CHLORIDE (POUR BTL) OPTIME
TOPICAL | Status: DC | PRN
  Administered 2012-11-25: 1000 mL

## 2012-11-25 MED ORDER — PROPOFOL 10 MG/ML IV BOLUS
INTRAVENOUS | Status: DC | PRN
  Administered 2012-11-25: 200 mg via INTRAVENOUS

## 2012-11-25 MED ORDER — MORPHINE SULFATE 2 MG/ML IJ SOLN
1.0000 mg | INTRAMUSCULAR | Status: DC | PRN
  Administered 2012-11-25 – 2012-11-26 (×2): 4 mg via INTRAVENOUS
  Filled 2012-11-25 (×2): qty 2

## 2012-11-25 MED ORDER — HYDROMORPHONE HCL PF 1 MG/ML IJ SOLN
INTRAMUSCULAR | Status: AC
  Administered 2012-11-25: 0.5 mg via INTRAVENOUS
  Filled 2012-11-25: qty 1

## 2012-11-25 MED ORDER — LACTATED RINGERS IV SOLN
INTRAVENOUS | Status: DC
  Administered 2012-11-25: 11:00:00 via INTRAVENOUS

## 2012-11-25 MED ORDER — CEFAZOLIN SODIUM 1-5 GM-% IV SOLN
1.0000 g | Freq: Three times a day (TID) | INTRAVENOUS | Status: AC
  Administered 2012-11-25 – 2012-11-26 (×2): 1 g via INTRAVENOUS
  Filled 2012-11-25 (×2): qty 50

## 2012-11-25 MED ORDER — SODIUM CHLORIDE 0.9 % IJ SOLN: 3.0000 mL | Freq: Two times a day (BID) | INTRAMUSCULAR | Status: DC

## 2012-11-25 MED ORDER — METOCLOPRAMIDE HCL 5 MG/ML IJ SOLN: 10.0000 mg | Freq: Once | INTRAMUSCULAR | Status: DC | PRN

## 2012-11-25 MED ORDER — ONDANSETRON HCL 4 MG/2ML IJ SOLN: 4.0000 mg | INTRAMUSCULAR | Status: DC | PRN

## 2012-11-25 MED ORDER — ZOLPIDEM TARTRATE 5 MG PO TABS: 5.0000 mg | ORAL_TABLET | Freq: Every evening | ORAL | Status: DC | PRN

## 2012-11-25 MED ORDER — ONDANSETRON HCL 4 MG/2ML IJ SOLN
INTRAMUSCULAR | Status: DC | PRN
  Administered 2012-11-25: 4 mg via INTRAVENOUS

## 2012-11-25 MED ORDER — DOCUSATE SODIUM 100 MG PO CAPS
100.0000 mg | ORAL_CAPSULE | Freq: Two times a day (BID) | ORAL | Status: DC
  Administered 2012-11-25 – 2012-11-26 (×2): 100 mg via ORAL
  Filled 2012-11-25 (×2): qty 1

## 2012-11-25 MED ORDER — OXYCODONE HCL 5 MG PO TABS
10.0000 mg | ORAL_TABLET | ORAL | Status: DC | PRN
  Administered 2012-11-26 (×2): 10 mg via ORAL
  Filled 2012-11-25 (×2): qty 2

## 2012-11-25 MED ORDER — ROCURONIUM BROMIDE 100 MG/10ML IV SOLN
INTRAVENOUS | Status: DC | PRN
  Administered 2012-11-25: 50 mg via INTRAVENOUS

## 2012-11-25 MED ORDER — ARTIFICIAL TEARS OP OINT
TOPICAL_OINTMENT | OPHTHALMIC | Status: DC | PRN
  Administered 2012-11-25: 1 via OPHTHALMIC

## 2012-11-25 MED ORDER — METHOCARBAMOL 500 MG PO TABS
500.0000 mg | ORAL_TABLET | Freq: Four times a day (QID) | ORAL | Status: DC | PRN
  Administered 2012-11-25 – 2012-11-26 (×3): 500 mg via ORAL
  Filled 2012-11-25 (×4): qty 1

## 2012-11-25 MED ORDER — LIDOCAINE HCL 4 % MT SOLN
OROMUCOSAL | Status: DC | PRN
  Administered 2012-11-25: 2 mL via TOPICAL

## 2012-11-25 MED ORDER — BUPIVACAINE-EPINEPHRINE PF 0.25-1:200000 % IJ SOLN
INTRAMUSCULAR | Status: AC
  Filled 2012-11-25: qty 30

## 2012-11-25 MED ORDER — LACTATED RINGERS IV SOLN
INTRAVENOUS | Status: DC | PRN
  Administered 2012-11-25 (×2): via INTRAVENOUS

## 2012-11-25 MED ORDER — DEXAMETHASONE SODIUM PHOSPHATE 4 MG/ML IJ SOLN
4.0000 mg | Freq: Four times a day (QID) | INTRAMUSCULAR | Status: DC
  Filled 2012-11-25 (×4): qty 1

## 2012-11-25 MED ORDER — PHENYLEPHRINE HCL 10 MG/ML IJ SOLN
INTRAMUSCULAR | Status: DC | PRN
  Administered 2012-11-25 (×2): 80 ug via INTRAVENOUS
  Administered 2012-11-25 (×3): 40 ug via INTRAVENOUS

## 2012-11-25 MED ORDER — MIDAZOLAM HCL 5 MG/5ML IJ SOLN
INTRAMUSCULAR | Status: DC | PRN
  Administered 2012-11-25 (×2): 1 mg via INTRAVENOUS

## 2012-11-25 MED ORDER — OXYCODONE HCL 5 MG PO TABS
5.0000 mg | ORAL_TABLET | Freq: Once | ORAL | Status: AC | PRN
  Administered 2012-11-25: 5 mg via ORAL

## 2012-11-25 MED ORDER — MENTHOL 3 MG MT LOZG
1.0000 | LOZENGE | OROMUCOSAL | Status: DC | PRN
  Administered 2012-11-26: 3 mg via ORAL
  Filled 2012-11-25: qty 9

## 2012-11-25 MED ORDER — LACTATED RINGERS IV SOLN: INTRAVENOUS | Status: DC

## 2012-11-25 MED ORDER — SODIUM CHLORIDE 0.9 % IV SOLN: 250.0000 mL | INTRAVENOUS | Status: DC

## 2012-11-25 MED ORDER — GLYCOPYRROLATE 0.2 MG/ML IJ SOLN
INTRAMUSCULAR | Status: DC | PRN
  Administered 2012-11-25: 0.6 mg via INTRAVENOUS

## 2012-11-25 MED ORDER — LIDOCAINE HCL (CARDIAC) 20 MG/ML IV SOLN
INTRAVENOUS | Status: DC | PRN
  Administered 2012-11-25: 100 mg via INTRAVENOUS

## 2012-11-25 MED ORDER — THROMBIN 20000 UNITS EX SOLR: CUTANEOUS | Status: DC | PRN

## 2012-11-25 MED ORDER — NEOSTIGMINE METHYLSULFATE 1 MG/ML IJ SOLN
INTRAMUSCULAR | Status: DC | PRN
  Administered 2012-11-25: 4 mg via INTRAVENOUS

## 2012-11-25 MED ORDER — DEXAMETHASONE 4 MG PO TABS
4.0000 mg | ORAL_TABLET | Freq: Four times a day (QID) | ORAL | Status: DC
  Administered 2012-11-25 – 2012-11-26 (×3): 4 mg via ORAL
  Filled 2012-11-25 (×6): qty 1

## 2012-11-25 MED ORDER — ACETAMINOPHEN 10 MG/ML IV SOLN
1000.0000 mg | Freq: Four times a day (QID) | INTRAVENOUS | Status: DC
  Administered 2012-11-25: 1000 mg via INTRAVENOUS
  Filled 2012-11-25: qty 100

## 2012-11-25 MED ORDER — LISINOPRIL 20 MG PO TABS
20.0000 mg | ORAL_TABLET | Freq: Every day | ORAL | Status: DC
  Administered 2012-11-25 – 2012-11-26 (×2): 20 mg via ORAL
  Filled 2012-11-25 (×2): qty 1

## 2012-11-25 MED ORDER — DEXAMETHASONE SODIUM PHOSPHATE 4 MG/ML IJ SOLN
4.0000 mg | Freq: Once | INTRAMUSCULAR | Status: AC
  Administered 2012-11-25: 8 mg via INTRAVENOUS
  Filled 2012-11-25: qty 1

## 2012-11-25 MED ORDER — HYDROCHLOROTHIAZIDE 25 MG PO TABS
25.0000 mg | ORAL_TABLET | Freq: Every day | ORAL | Status: DC
  Administered 2012-11-25 – 2012-11-26 (×2): 25 mg via ORAL
  Filled 2012-11-25 (×2): qty 1

## 2012-11-25 MED ORDER — LISINOPRIL-HYDROCHLOROTHIAZIDE 20-25 MG PO TABS: 2.0000 | ORAL_TABLET | Freq: Every day | ORAL | Status: DC

## 2012-11-25 MED ORDER — METHOCARBAMOL 100 MG/ML IJ SOLN
500.0000 mg | Freq: Four times a day (QID) | INTRAVENOUS | Status: DC | PRN
  Filled 2012-11-25: qty 5

## 2012-11-25 MED ORDER — OXYCODONE HCL 5 MG/5ML PO SOLN: 5.0000 mg | Freq: Once | ORAL | Status: AC | PRN

## 2012-11-25 MED ORDER — ACETAMINOPHEN 10 MG/ML IV SOLN
1000.0000 mg | Freq: Four times a day (QID) | INTRAVENOUS | Status: DC
  Administered 2012-11-26 (×2): 1000 mg via INTRAVENOUS
  Filled 2012-11-25 (×4): qty 100

## 2012-11-25 SURGICAL SUPPLY — 61 items
BLADE SURG 15 STRL LF DISP TIS (BLADE) IMPLANT
BLADE SURG 15 STRL SS (BLADE)
BLADE SURG ROTATE 9660 (MISCELLANEOUS) ×2 IMPLANT
BUR EGG ELITE 4.0 (BURR) IMPLANT
BUR MATCHSTICK NEURO 3.0 LAGG (BURR) IMPLANT
CANISTER SUCTION 2500CC (MISCELLANEOUS) ×2 IMPLANT
CLOTH BEACON ORANGE TIMEOUT ST (SAFETY) ×2 IMPLANT
CLSR STERI-STRIP ANTIMIC 1/2X4 (GAUZE/BANDAGES/DRESSINGS) ×2 IMPLANT
CORDS BIPOLAR (ELECTRODE) ×2 IMPLANT
COVER SURGICAL LIGHT HANDLE (MISCELLANEOUS) ×4 IMPLANT
CRADLE DONUT ADULT HEAD (MISCELLANEOUS) ×2 IMPLANT
DEVICE ENDSKLTN TC MED 8MM (Orthopedic Implant) ×2 IMPLANT
DRAPE C-ARM 42X72 X-RAY (DRAPES) ×2 IMPLANT
DRAPE POUCH INSTRU U-SHP 10X18 (DRAPES) ×2 IMPLANT
DRAPE SURG 17X23 STRL (DRAPES) ×2 IMPLANT
DRAPE U-SHAPE 47X51 STRL (DRAPES) ×2 IMPLANT
DRSG MEPILEX BORDER 4X4 (GAUZE/BANDAGES/DRESSINGS) ×2 IMPLANT
DURAPREP 26ML APPLICATOR (WOUND CARE) ×2 IMPLANT
ELECT COATED BLADE 2.86 ST (ELECTRODE) ×2 IMPLANT
ELECT REM PT RETURN 9FT ADLT (ELECTROSURGICAL) ×2
ELECTRODE REM PT RTRN 9FT ADLT (ELECTROSURGICAL) ×1 IMPLANT
ENDOSKELTON TC IMPLANT 8MM MED (Orthopedic Implant) ×4 IMPLANT
GLOVE BIO SURGEON STRL SZ 6.5 (GLOVE) ×2 IMPLANT
GLOVE BIOGEL PI IND STRL 7.0 (GLOVE) ×1 IMPLANT
GLOVE BIOGEL PI IND STRL 8.5 (GLOVE) ×1 IMPLANT
GLOVE BIOGEL PI INDICATOR 7.0 (GLOVE) ×1
GLOVE BIOGEL PI INDICATOR 8.5 (GLOVE) ×1
GLOVE ECLIPSE 8.5 STRL (GLOVE) ×2 IMPLANT
GOWN PREVENTION PLUS XXLARGE (GOWN DISPOSABLE) ×2 IMPLANT
GOWN STRL REIN XL XLG (GOWN DISPOSABLE) ×4 IMPLANT
KIT BASIN OR (CUSTOM PROCEDURE TRAY) ×2 IMPLANT
KIT ROOM TURNOVER OR (KITS) ×2 IMPLANT
NEEDLE SPNL 18GX3.5 QUINCKE PK (NEEDLE) ×2 IMPLANT
NS IRRIG 1000ML POUR BTL (IV SOLUTION) ×2 IMPLANT
PACK ORTHO CERVICAL (CUSTOM PROCEDURE TRAY) ×2 IMPLANT
PACK UNIVERSAL I (CUSTOM PROCEDURE TRAY) ×2 IMPLANT
PAD ARMBOARD 7.5X6 YLW CONV (MISCELLANEOUS) ×4 IMPLANT
PATTIES SURGICAL .25X.25 (GAUZE/BANDAGES/DRESSINGS) IMPLANT
PIN DISTRACTION 14 (PIN) ×2 IMPLANT
PIN RETAINER PRODISC 14 MM (PIN) ×4 IMPLANT
PIN TEMP SKYLINE THREADED (PIN) ×2 IMPLANT
PLATE SKYLINE TWO LEVEL 32MM (Plate) ×2 IMPLANT
PUTTY BONE DBX 5CC MIX (Putty) ×2 IMPLANT
RESTRAINT LIMB HOLDER UNIV (RESTRAINTS) ×2 IMPLANT
SCREW SKYLINE 14MM SD-VA (Screw) ×12 IMPLANT
SPONGE INTESTINAL PEANUT (DISPOSABLE) ×4 IMPLANT
SPONGE LAP 4X18 X RAY DECT (DISPOSABLE) IMPLANT
SPONGE SURGIFOAM ABS GEL 100 (HEMOSTASIS) ×2 IMPLANT
SURGIFLO TRUKIT (HEMOSTASIS) IMPLANT
SUT MNCRL AB 3-0 PS2 18 (SUTURE) ×2 IMPLANT
SUT SILK 2 0 (SUTURE)
SUT SILK 2-0 18XBRD TIE 12 (SUTURE) IMPLANT
SUT VIC AB 2-0 CT1 18 (SUTURE) ×2 IMPLANT
SYR BULB IRRIGATION 50ML (SYRINGE) ×2 IMPLANT
SYR CONTROL 10ML LL (SYRINGE) ×2 IMPLANT
TAPE CLOTH 4X10 WHT NS (GAUZE/BANDAGES/DRESSINGS) ×2 IMPLANT
TAPE UMBILICAL COTTON 1/8X30 (MISCELLANEOUS) ×2 IMPLANT
TOWEL OR 17X24 6PK STRL BLUE (TOWEL DISPOSABLE) ×2 IMPLANT
TOWEL OR 17X26 10 PK STRL BLUE (TOWEL DISPOSABLE) ×2 IMPLANT
TRAY FOLEY CATH 16FRSI W/METER (SET/KITS/TRAYS/PACK) IMPLANT
WATER STERILE IRR 1000ML POUR (IV SOLUTION) ×2 IMPLANT

## 2012-11-25 NOTE — Anesthesia Procedure Notes (Signed)
Procedure Name: Intubation Date/Time: 11/25/2012 4:04 PM Performed by: Gayla Medicus Pre-anesthesia Checklist: Patient identified, Timeout performed, Emergency Drugs available, Suction available and Patient being monitored Patient Re-evaluated:Patient Re-evaluated prior to inductionOxygen Delivery Method: Circle system utilized Preoxygenation: Pre-oxygenation with 100% oxygen Intubation Type: IV induction Ventilation: Mask ventilation without difficulty and Oral airway inserted - appropriate to patient size Laryngoscope Size: Mac and 4 Grade View: Grade I Tube type: Oral Tube size: 7.5 mm Number of attempts: 1 Airway Equipment and Method: Stylet and LTA kit utilized Placement Confirmation: ETT inserted through vocal cords under direct vision,  positive ETCO2 and breath sounds checked- equal and bilateral Secured at: 24 cm Tube secured with: Tape Dental Injury: Teeth and Oropharynx as per pre-operative assessment

## 2012-11-25 NOTE — Anesthesia Postprocedure Evaluation (Signed)
  Anesthesia Post-op Note  Patient: [name]  Procedure(s) Performed: Procedure(s): ANTERIOR CERVICAL DECOMPRESSION/DISCECTOMY FUSION C4- C6 2 LEVEL (N/A)  Patient Location: PACU  Anesthesia Type:General  Level of Consciousness: awake  Airway and Oxygen Therapy: Patient Spontanous Breathing  Post-op Pain: mild  Post-op Assessment: Post-op Vital signs reviewed, Patient's Cardiovascular Status Stable, Respiratory Function Stable, Patent Airway, No signs of Nausea or vomiting and Pain level controlled  Post-op Vital Signs: stable  Complications: No apparent anesthesia complications

## 2012-11-25 NOTE — H&P (Signed)
No change in clinical exam H+P reviewed  

## 2012-11-25 NOTE — Anesthesia Preprocedure Evaluation (Signed)
Anesthesia Evaluation  Patient identified by MRN, date of birth, ID band Patient awake    Reviewed: Allergy & Precautions, H&P , NPO status , Patient's Chart, lab work & pertinent test results, reviewed documented beta blocker date and time   Airway Mallampati: II TM Distance: >3 FB Neck ROM: full    Dental   Pulmonary sleep apnea , pneumonia -, resolved,  breath sounds clear to auscultation        Cardiovascular hypertension, Rhythm:regular     Neuro/Psych  Headaches, PSYCHIATRIC DISORDERS  Neuromuscular disease    GI/Hepatic Neg liver ROS, PUD,   Endo/Other  diabetesMorbid obesity  Renal/GU negative Renal ROS  negative genitourinary   Musculoskeletal   Abdominal   Peds  Hematology negative hematology ROS (+)   Anesthesia Other Findings See surgeon's H&P   Reproductive/Obstetrics negative OB ROS                           Anesthesia Physical Anesthesia Plan  ASA: III  Anesthesia Plan: General   Post-op Pain Management:    Induction: Intravenous  Airway Management Planned: Oral ETT  Additional Equipment:   Intra-op Plan:   Post-operative Plan: Extubation in OR  Informed Consent: I have reviewed the patients History and Physical, chart, labs and discussed the procedure including the risks, benefits and alternatives for the proposed anesthesia with the patient or authorized representative who has indicated his/her understanding and acceptance.   Dental Advisory Given  Plan Discussed with: CRNA and Surgeon  Anesthesia Plan Comments:         Anesthesia Quick Evaluation

## 2012-11-25 NOTE — Transfer of Care (Signed)
Immediate Anesthesia Transfer of Care Note  Patient: [name]  Procedure(s) Performed: Procedure(s): ANTERIOR CERVICAL DECOMPRESSION/DISCECTOMY FUSION C4- C6 2 LEVEL (N/A)  Patient Location: PACU  Anesthesia Type:General  Level of Consciousness: awake, alert  and oriented  Airway & Oxygen Therapy: Patient Spontanous Breathing and Patient connected to nasal cannula oxygen  Post-op Assessment: Report given to PACU RN, Post -op Vital signs reviewed and stable and Patient moving all extremities X 4  Post vital signs: Reviewed and stable  Complications: No apparent anesthesia complications

## 2012-11-25 NOTE — Brief Op Note (Signed)
11/25/2012  6:17 PM  PATIENT:  [name]  57 y.o. male  PRE-OPERATIVE DIAGNOSIS:  cervical spondylotic radiculopathy  POST-OPERATIVE DIAGNOSIS:  cervical spondylotic radiculopathy  PROCEDURE:  Procedure(s): ANTERIOR CERVICAL DECOMPRESSION/DISCECTOMY FUSION C4- C6 2 LEVEL (N/A)  SURGEON:  Surgeon(s) and Role:    * Venita Lick, MD - Primary  PHYSICIAN ASSISTANT:   ASSISTANTS: Zonia Kief   ANESTHESIA:   general  EBL:  Total I/O In: 1200 [I.V.:1200] Out: 50 [Blood:50]  BLOOD ADMINISTERED:none  DRAINS: none   LOCAL MEDICATIONS USED:  MARCAINE     SPECIMEN:  No Specimen  DISPOSITION OF SPECIMEN:  N/A  COUNTS:  YES  TOURNIQUET:  * No tourniquets in log *  DICTATION: .Other Dictation: Dictation Number [id number]  PLAN OF CARE: Admit for overnight observation  PATIENT DISPOSITION:  PACU - hemodynamically stable.

## 2012-11-25 NOTE — Preoperative (Signed)
Beta Blockers   Reason not to administer Beta Blockers:Not Applicable 

## 2012-11-26 ENCOUNTER — Encounter (HOSPITAL_COMMUNITY): Payer: Self-pay | Admitting: Orthopedic Surgery

## 2012-11-26 MED ORDER — ACETAMINOPHEN 500 MG PO TABS
1000.0000 mg | ORAL_TABLET | Freq: Four times a day (QID) | ORAL | Status: DC
  Filled 2012-11-26 (×2): qty 2

## 2012-11-26 MED ORDER — METHOCARBAMOL 500 MG PO TABS: 500.0000 mg | ORAL_TABLET | Freq: Four times a day (QID) | ORAL | Status: DC | PRN

## 2012-11-26 MED ORDER — OXYCODONE-ACETAMINOPHEN 10-325 MG PO TABS: 1.0000 | ORAL_TABLET | ORAL | Status: DC | PRN

## 2012-11-26 NOTE — Progress Notes (Signed)
Patient ambulated in the hallway, one complete round, with no assistance. Tolerated very well.

## 2012-11-26 NOTE — Progress Notes (Signed)
PT Cancellation Note  Patient Details Name: [name] MRN: [id number] DOB: 1955/03/27   Cancelled Treatment:    Reason Eval/Treat Not Completed: PT screened, no needs identified, will sign off   Lanesha Azzaro 11/26/2012, 9:56 AM

## 2012-11-26 NOTE — Discharge Summary (Signed)
Physician Discharge Summary  Patient ID: [name] MRN: [id number] DOB/AGE: [birth year] 57 y.o.  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  Tolerated procedure well and without complication.  Transferred to Ortho unit.  4 sep, doing well.  Long hall ambulation without difficulty.  Pain controlled.  Ready to go home.    Consults: None     Discharge Exam: Blood pressure 128/68, pulse 107, temperature 98.5 F (36.9 C), temperature source Oral, resp. rate 20, height 5\' 10"  (1.778 m), weight 102.785 kg (226 lb 9.6 oz), SpO2 93.00%. ddressing C/D/I.  Moves extremities well.  Neurologically intact.  No weakness.    Disposition:  good  Discharge Orders   Future Orders Complete By Expires   Call MD / Call 911  As directed    Comments:     If you experience chest pain or shortness of breath, CALL 911 and be transported to the hospital emergency room.  If you develope a fever above 101 F, pus (white drainage) or increased drainage or redness at the wound, or calf pain, call your surgeon's office.   Constipation Prevention  As directed    Comments:     Drink plenty of fluids.  Prune juice may be helpful.  You may use a stool softener, such as Colace (over the counter) 100 mg twice a day.  Use MiraLax (over the counter) for constipation as needed.   Diet - low sodium heart healthy  As directed    Driving restrictions  As directed    Comments:     No driving until further notice.   Increase activity slowly as tolerated  As directed    Lifting restrictions  As directed    Comments:     No lifting until further notice.       Medication List    STOP taking these medications       aspirin EC 81 MG tablet      HYDROcodone-acetaminophen 10-325 MG per tablet  Commonly known as:  NORCO     ibuprofen 200 MG tablet  Commonly known as:  ADVIL,MOTRIN      TAKE these medications       lisinopril-hydrochlorothiazide 20-25 MG per tablet  Commonly known as:  PRINZIDE,ZESTORETIC  Take 2 tablets by mouth daily.     methocarbamol 500 MG tablet  Commonly known as:  ROBAXIN  Take 1 tablet (500 mg total) by mouth every 6 (six) hours as needed (spasms).     oxyCODONE-acetaminophen 10-325 MG per tablet  Commonly known as:  PERCOCET  Take 1-2 tablets by mouth every 4 (four) hours as needed for pain.     PARoxetine 20 MG tablet  Commonly known as:  PAXIL  Take 20 mg by mouth daily.     pravastatin 40 MG tablet  Commonly known as:  PRAVACHOL  Take 40 mg by mouth daily.     testosterone cypionate 100 MG/ML injection  Commonly known as:  DEPOTESTOTERONE CYPIONATE  Inject 100 mg into the muscle every 14 (fourteen) days. For IM use only           Follow-up Information   Schedule an appointment as soon as possible for a visit with Alvy Beal, MD. (return  office visit 2 weeks postop)    Specialty:  Orthopedic Surgery   Contact information:   [address] [place] [address] [phone]       Signed: Naida Sleight 11/26/2012, 10:55 AM

## 2012-11-26 NOTE — Progress Notes (Signed)
OT Cancellation Note/ discharge  Patient Details Name: BERTRUM HELMSTETTER MRN: [id number] DOB: [birth year]   Cancelled Treatment:    Reason Eval/Treat Not Completed: OT screened, no needs identified, will sign off. PT Cary spoke with RN who reports no therapy needs. Pt is dressed and d/c at this time. Ot informed by PT [facility] and screen placed. Ot to sign off  Harolyn Rutherford Pager: [phone]  11/26/2012, 9:08 AM

## 2012-11-26 NOTE — Op Note (Signed)
NAMEBURLIE, CAJAMARCA NO.:  [id number]  MEDICAL RECORD NO.:  [id number]  LOCATION:  [id number]                        FACILITY:  [facility]  PHYSICIAN:  Alvy Beal, MD    DATE OF BIRTH:  03/28/1955  DATE OF PROCEDURE:  11/25/2012 DATE OF DISCHARGE:                              OPERATIVE REPORT   PREOPERATIVE DIAGNOSIS:  __________ cervical spondylotic radiculopathy.  POSTOPERATIVE DIAGNOSE:  __________ cervical spondylotic radiculopathy.  OPERATIVE PROCEDURE:  Anterior cervical diskectomy, C4-5, C5-6.  COMPLICATIONS:  None.  CONDITION:  Stable.  HISTORY:  This is a very pleasant gentleman who has been having severe progressive neck and radicular right arm pain.  Attempts with conservative management has failed to alleviate his symptoms.  As a result, surgical intervention was advised.  All appropriate risks, benefits, and alternatives were discussed with the patient and consent was obtained.  OPERATIVE NOTE:  The patient was brought to the operating room, placed supine on the operating table.  After successful induction of general anesthesia, and endotracheal intubation, TEDs, SCDs were placed.  The patient was then intubated.  After positioning and intubation, the anterior cervical spine was prepped and draped in a standard fashion.  Time-out was taken to confirm the patient, procedure, and all other pertinent important data.  Once this was completed, a longitudinal incision was made on the left side.  This was done just over the sternocleidomastoid.  Sharp dissection was carried down to and through the platysma, sharply dissected through the deep cervical fascia sweeping the trachea and esophagus to the right.  The omohyoid was identified and sacrificed.  This allowed for excellent visualization. The trachea and esophagus were then gently mobilized to the right and then identified and protected the carotid sheath laterally in that finger.  I then dissected  bluntly with Kittner dissectors through the prevertebral fascia to expose the anterior longitudinal ligament.  A needle was placed into the C4-5 disk space.  Intraoperative x-ray confirmed the appropriate __________.  Once this was confirmed, I then mobilized the longus colli muscles bilaterally using bipolar electrocautery from the midbody of C4 to the midbody of C6.  Self- retaining retractor blades were placed underneath the longus colli muscle.  The endotracheal cuff was deflated and I extended the retractors to the appropriate __________ .  Then, annulotomy was performed at C4-5 with a 15 blade scalpel, then using a combination of pituitary rongeurs, and Kerrison rongeurs, the bulk of the disk material was removed as was the overhanging osteophyte from the inferior aspect of the body of C4.  Once I had cleared out some of the disk space, I then placed distraction pins into the bodies of C4 and C5, and then distracted the space and then maintained the distraction with the distraction pins.  I then continued my diskectomy with nerve curette posteriorly.  I then released the annulus and then using a 1-mm Kerrison removed the posterior bone spur from the bodies of C4 and C5.  I then went under the uncovertebral joint on the right-hand side and decompressed this.  I released the annulus and then used a nerve hook to develop plane underneath the posterior  longitudinal ligament.  This was also sacrificed using a 1 mm Kerrison.  At this point, I had adequate decompression of the L4-5 disk space.  I then rasped the endplates to ensure I had bleeding subchondral bone.  I then measured with trial devices.  I then elected to take the 8 Titan titanium intervertebral spacer packed with DBX mix; this is a medium lordotic cage, I malleted it to the appropriate depth and had excellent fixation.  Once this was completed, I then repositioned the distraction pins into the bodies from the body of C4 to  that of C6 and distracted the C5-6 disk space and using the same technique, I used a C4-5 complete diskectomy at C5-6. Again, I released the posterior anulus, and made sure I had an adequate decompression.  Bleeding subchondral bone was changed after placing a same-sized graft.  Once these both grafts were secured in place, I then took a 32-mm anterior cervical DePuy SKYLINE plate, and affixed it to the bodies of C4 and C6 with 14-mm self-drilling screws.  All screws had excellent purchase.  I then placed the same size screws into the body of C5.  All screws were torqued down and then tightened accordingly __________ standards.  X-rays were taken, which demonstrated satisfactory position of the graft and plate.  The wound was copiously irrigated with normal saline.  The retractors were removed and I irrigated the wound copiously with normal saline.  The trachea and esophagus were moved back to the midline.  The platysma was closed with interrupted 2-0 Vicryl sutures, and 3-0 Monocryl for the skin.  Steri-Strips and dry dressing, and an Aspen collar were applied.  The patient was extubated, transferred to the PACU without incident.  At the end of the case, all needle and sponge counts were correct.  There was no adverse intraoperative events.     Alvy Beal, MD     DDB/MEDQ  D:  11/25/2012  T:  11/26/2012  Job:  [id number]

## 2012-11-26 NOTE — Progress Notes (Signed)
Subjective: Doing great.  Pain controlled.  No complaints.  Long hall ambulation without issues.  Ready to go home.  No dysphagia.   Objective: Vital signs in last 24 hours: Temp:  [97.3 F (36.3 C)-98.5 F (36.9 C)] 98.1 F (36.7 C) (09/04 0530) Pulse Rate:  [81-113] 94 (09/04 0530) Resp:  [12-20] 16 (09/04 0530) BP: (117-165)/(71-98) 117/71 mmHg (09/04 0530) SpO2:  [92 %-97 %] 95 % (09/04 0530) Weight:  [102.785 kg (226 lb 9.6 oz)] 102.785 kg (226 lb 9.6 oz) (09/03 2124)  Intake/Output from previous day: 09/03 0701 - 09/04 0700 In: 1200 [I.V.:1200] Out: 400 [Urine:350; Blood:50] Intake/Output this shift:     Recent Labs  11/24/12 1412  HGB 18.4*    Recent Labs  11/24/12 1412  WBC 8.0  RBC 5.43  HCT 51.9  PLT 220    Recent Labs  11/24/12 1412  NA 137  K 4.3  CL 99  CO2 26  BUN 17  CREATININE 1.00  GLUCOSE 156*  CALCIUM 10.2   No results found for this basename: LABPT, INR,  in the last 72 hours Exam:  Dressing c/d/i.  Neurologically intact.     Assessment/Plan: D/c home today.  F/u in 2 weeks.  Scripts for percocet and robaxin on chart.    Porshia Blizzard M 11/26/2012, 8:58 AM

## 2013-02-26 ENCOUNTER — Other Ambulatory Visit (HOSPITAL_COMMUNITY): Payer: Self-pay | Admitting: Family Medicine

## 2013-02-26 ENCOUNTER — Ambulatory Visit (HOSPITAL_COMMUNITY)
Admission: RE | Admit: 2013-02-26 | Discharge: 2013-02-26 | Disposition: A | Discharge status: Home / Self Care | Payer: BC Managed Care – PPO | Source: Ambulatory Visit | Attending: Family Medicine | Admitting: Family Medicine

## 2013-02-26 DIAGNOSIS — I6529 Occlusion and stenosis of unspecified carotid artery: Secondary | ICD-10-CM | POA: Insufficient documentation

## 2013-02-26 DIAGNOSIS — R42 Dizziness and giddiness: Secondary | ICD-10-CM | POA: Insufficient documentation

## 2013-02-26 DIAGNOSIS — I658 Occlusion and stenosis of other precerebral arteries: Secondary | ICD-10-CM | POA: Insufficient documentation

## 2013-02-26 NOTE — Progress Notes (Signed)
Bilateral carotid artery duplex:  1-39% ICA stenosis.  Vertebral artery flow is antegrade.     

## 2013-07-28 ENCOUNTER — Telehealth: Payer: Self-pay

## 2013-09-10 ENCOUNTER — Emergency Department (HOSPITAL_COMMUNITY): Payer: No Typology Code available for payment source

## 2013-09-10 ENCOUNTER — Emergency Department (HOSPITAL_COMMUNITY)
Admission: EM | Admit: 2013-09-10 | Discharge: 2013-09-10 | Disposition: A | Discharge status: Home / Self Care | Payer: No Typology Code available for payment source | Attending: Emergency Medicine | Admitting: Emergency Medicine

## 2013-09-10 ENCOUNTER — Encounter (HOSPITAL_COMMUNITY): Payer: Self-pay | Admitting: Emergency Medicine

## 2013-09-10 DIAGNOSIS — Z8619 Personal history of other infectious and parasitic diseases: Secondary | ICD-10-CM | POA: Insufficient documentation

## 2013-09-10 DIAGNOSIS — Z8669 Personal history of other diseases of the nervous system and sense organs: Secondary | ICD-10-CM | POA: Insufficient documentation

## 2013-09-10 DIAGNOSIS — I10 Essential (primary) hypertension: Secondary | ICD-10-CM | POA: Insufficient documentation

## 2013-09-10 DIAGNOSIS — Z8709 Personal history of other diseases of the respiratory system: Secondary | ICD-10-CM | POA: Insufficient documentation

## 2013-09-10 DIAGNOSIS — E785 Hyperlipidemia, unspecified: Secondary | ICD-10-CM | POA: Insufficient documentation

## 2013-09-10 DIAGNOSIS — IMO0002 Reserved for concepts with insufficient information to code with codable children: Secondary | ICD-10-CM

## 2013-09-10 DIAGNOSIS — Z87891 Personal history of nicotine dependence: Secondary | ICD-10-CM | POA: Insufficient documentation

## 2013-09-10 DIAGNOSIS — Z7982 Long term (current) use of aspirin: Secondary | ICD-10-CM | POA: Insufficient documentation

## 2013-09-10 DIAGNOSIS — F329 Major depressive disorder, single episode, unspecified: Secondary | ICD-10-CM | POA: Insufficient documentation

## 2013-09-10 DIAGNOSIS — Z860100 Personal history of colon polyps, unspecified: Secondary | ICD-10-CM | POA: Insufficient documentation

## 2013-09-10 DIAGNOSIS — G8929 Other chronic pain: Secondary | ICD-10-CM | POA: Insufficient documentation

## 2013-09-10 DIAGNOSIS — F3289 Other specified depressive episodes: Secondary | ICD-10-CM | POA: Insufficient documentation

## 2013-09-10 DIAGNOSIS — Z8701 Personal history of pneumonia (recurrent): Secondary | ICD-10-CM | POA: Insufficient documentation

## 2013-09-10 DIAGNOSIS — Y939 Activity, unspecified: Secondary | ICD-10-CM | POA: Insufficient documentation

## 2013-09-10 DIAGNOSIS — M129 Arthropathy, unspecified: Secondary | ICD-10-CM | POA: Insufficient documentation

## 2013-09-10 DIAGNOSIS — W268XXA Contact with other sharp object(s), not elsewhere classified, initial encounter: Secondary | ICD-10-CM | POA: Insufficient documentation

## 2013-09-10 DIAGNOSIS — E119 Type 2 diabetes mellitus without complications: Secondary | ICD-10-CM | POA: Insufficient documentation

## 2013-09-10 DIAGNOSIS — Z79899 Other long term (current) drug therapy: Secondary | ICD-10-CM | POA: Insufficient documentation

## 2013-09-10 DIAGNOSIS — Y929 Unspecified place or not applicable: Secondary | ICD-10-CM | POA: Insufficient documentation

## 2013-09-10 DIAGNOSIS — S61509A Unspecified open wound of unspecified wrist, initial encounter: Secondary | ICD-10-CM | POA: Insufficient documentation

## 2013-09-10 MED ORDER — MORPHINE SULFATE 4 MG/ML IJ SOLN
6.0000 mg | Freq: Once | INTRAMUSCULAR | Status: AC
  Administered 2013-09-10: 6 mg via INTRAVENOUS
  Filled 2013-09-10: qty 2

## 2013-09-10 NOTE — ED Provider Notes (Signed)
CSN: [id number]     Arrival date & time 09/10/13  1725 History   First MD Initiated Contact with Patient 09/10/13 1755     Chief Complaint  Patient presents with  . Extremity Laceration     (Consider location/radiation/quality/duration/timing/severity/associated sxs/prior Treatment) Patient is a 58 y.o. male presenting with skin laceration.  Laceration Location:  Shoulder/arm Shoulder/arm laceration location:  R wrist Length (cm):  4 Depth:  Through underlying tissue Quality: jagged   Bleeding: controlled with pressure   Time since incident:  1 hour Laceration mechanism:  Metal edge Pain details:    Quality:  Aching, throbbing and sharp   Timing:  Intermittent   Progression:  Improving Foreign body present:  No foreign bodies Ineffective treatments:  None tried Tetanus status:  Up to date   Past Medical History  Diagnosis Date  . Hypertension     takes Prinzide daily  . Hyperlipidemia     takes Pravastatin daily  . Sleep apnea     sleep study in epic from 2010;doesn't use CPAP  . History of bronchitis     last time about 359yrs ago  . Pneumonia     last time about 3210yrs ago  . Headache(784.0)     pt states he keeps one related to cervical issues  . Weakness     and numbness to right hand  . Arthritis   . Chronic back pain   . History of gout   . Gastric ulcer   . History of colon polyps   . Diverticulosis   . Urinary frequency   . Nocturia   . Diabetes mellitus without complication     but doesn't take any meds;diet controlled  . Depression     takes Paxil daily  . History of staph infection 7020yrs ago  . Carpal tunnel syndrome     left hand   Past Surgical History  Procedure Laterality Date  . Left finger surgery    . Appendectomy    . Right knee arthroscopy    . Skin cancer spot removed a month ago    . Colonoscopy    . Esophagogastroduodenoscopy    . Right thumb surgery      fused  . Right carpal tunnel surgery    . Anterior cervical  decomp/discectomy fusion N/A 11/25/2012    Procedure: ANTERIOR CERVICAL DECOMPRESSION/DISCECTOMY FUSION C4- C6 2 LEVEL;  Surgeon: Venita Lickahari Brooks, MD;  Location: [facility] OR;  Service: Orthopedics;  Laterality: N/A;   History reviewed. No pertinent family history. History  Substance Use Topics  . Smoking status: Former Games developermoker  . Smokeless tobacco: Not on file     Comment: quit smoking 6419yrs ago  . Alcohol Use: Yes     Comment: once a month    Review of Systems  Constitutional: Negative for fever and chills.  HENT: Negative for congestion and rhinorrhea.   Eyes: Negative for pain.  Respiratory: Negative for cough and shortness of breath.   Cardiovascular: Negative for chest pain and palpitations.  Gastrointestinal: Negative for vomiting, abdominal pain, diarrhea and constipation.  Endocrine: Negative for polydipsia and polyuria.  Genitourinary: Negative for dysuria and flank pain.  Musculoskeletal: Negative for back pain and neck pain.  Skin: Positive for wound (right wrist). Negative for color change.  Neurological: Negative for dizziness, numbness and headaches.      Allergies  Tetracyclines & related  Home Medications   Prior to Admission medications   Medication Sig Start Date End Date Taking? Authorizing Provider  aspirin 81 MG tablet Take 81 mg by mouth daily.   Yes Historical Provider, MD  HYDROcodone-acetaminophen (NORCO) 10-325 MG per tablet Take 2 tablets by mouth every 6 (six) hours as needed for moderate pain.   Yes Historical Provider, MD  lisinopril-hydrochlorothiazide (PRINZIDE,ZESTORETIC) 20-25 MG per tablet Take 2 tablets by mouth daily.   Yes Historical Provider, MD  methocarbamol (ROBAXIN) 500 MG tablet Take 1 tablet (500 mg total) by mouth every 6 (six) hours as needed (spasms). 11/26/12  Yes Zonia Kief, PA-C  PARoxetine (PAXIL) 20 MG tablet Take 20 mg by mouth daily.   Yes Historical Provider, MD  pravastatin (PRAVACHOL) 40 MG tablet Take 40 mg by mouth daily.   Yes  Historical Provider, MD  testosterone cypionate (DEPOTESTOTERONE CYPIONATE) 100 MG/ML injection Inject 100 mg into the muscle every 14 (fourteen) days. For IM use only   Yes Historical Provider, MD  vitamin B-12 (CYANOCOBALAMIN) 1000 MCG tablet Take 1,000 mcg by mouth daily.   Yes Historical Provider, MD   BP 118/84  Pulse 80  Temp(Src) 97.8 F (36.6 C)  Resp 19  Wt 210 lb (95.255 kg)  SpO2 100% Physical Exam  Nursing note and vitals reviewed. Constitutional: He is oriented to person, place, and time. He appears well-developed and well-nourished.  HENT:  Head: Normocephalic and atraumatic.  Eyes: Conjunctivae and EOM are normal. Pupils are equal, round, and reactive to light.  Neck: Normal range of motion.  Cardiovascular: Normal rate and regular rhythm.   Pulmonary/Chest: Effort normal and breath sounds normal.  Abdominal: Soft. He exhibits no distension. There is no tenderness.  Musculoskeletal: Normal range of motion. He exhibits no edema and no tenderness.  Neurological: He is alert and oriented to person, place, and time.  Skin: Skin is warm and dry.  4 cm jagged laceration to right wrist. Bleeding controlled with pressure. No ligamentous or obvious arterial damage. Good sensation, cap refill, normal motor distal to wound before and after closure.     ED Course  LACERATION REPAIR Date/Time: 09/10/2013 9:45 PM Performed by: Marily Memos Authorized by: Vanetta Mulders Consent: Verbal consent obtained. Risks and benefits: risks, benefits and alternatives were discussed Consent given by: patient Patient understanding: patient states understanding of the procedure being performed Required items: required blood products, implants, devices, and special equipment available Patient identity confirmed: verbally with patient and arm band Body area: upper extremity Location details: right wrist Laceration length: 4 cm Foreign bodies: no foreign bodies Tendon involvement:  none Nerve involvement: none Vascular damage: no Anesthesia: local infiltration Local anesthetic: lidocaine 2% with epinephrine Anesthetic total: 6 ml Patient sedated: no Preparation: Patient was prepped and draped in the usual sterile fashion. Irrigation solution: saline Irrigation method: syringe Amount of cleaning: standard Debridement: none Degree of undermining: none Skin closure: 3-0 Prolene Subcutaneous closure: 3-0 Vicryl Number of sutures: 8 Technique: simple Approximation: close Approximation difficulty: complex Dressing: 4x4 sterile gauze and gauze roll Patient tolerance: Patient tolerated the procedure well with no immediate complications.   (including critical care time) Labs Review Labs Reviewed - No data to display  Imaging Review Dg Wrist Complete Right  09/10/2013   CLINICAL DATA:  Right wrist laceration  EXAM: RIGHT WRIST - COMPLETE 3+ VIEW  COMPARISON:  None.  FINDINGS: No fracture or dislocation. Ulnar minus variance. Widening of the scapholunate distance as can be seen with a scapholunate ligament tear. Dorsal tilting of the lunate. 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 No radiopaque foreign body. Bandaging material is noted  around the right wrist.  IMPRESSION: No acute osseous abnormality of the right wrist.   Electronically Signed   By: Elige KoHetal  Patel   On: 09/10/2013 18:45     EKG Interpretation None      MDM   Final diagnoses:  Laceration    Pertinent positives and negatives from above HPI, ROS and PE include: 58 yo M cut his right wrist on sheet metal with resulting complex laceration. No SI. Tetanus UTD. Moderate blood loss per patient but no symptoms of acute blood loss anemia. Exam as above, NVI distally. Repaired as above after xr without evidence of foreign body or fracture and copious irrigation. Patient still with full function of all digits with comparable strength to other hand. Cap refill < 2 seconds, sensation intact. Clean metal  so no need for abx. UTD on tetanus. Has pain meds at home.   Discharge instructions, including strict return precautions for new or worsening symptoms, given. Patient and/or family verbalized understanding and agreement with the plan as described.   Labs, studies and imaging reviewed by myself and considered in medical decision making if ordered. Imaging interpreted by radiology. Pt was discussed with my attending, Dr. Deretha EmoryZackowski.  Discharge Medication List as of 09/10/2013  9:33 PM      Follow-up Information   Follow up with Kaleen MaskELKINS,WILSON OLIVER, MD In 1 week. (for wound check and suture removal)    Specialty:  Family Medicine   Contact information:   [address] [place] [address] [phone]       [facility] (for wound check and suture removal if you can't see your PCP. )    Specialty:  Emergency Medicine   Contact information:   [address] [id number] [place] [id number] [phone]         Marily MemosJason Daxter Paule, MD 09/11/13 1154

## 2013-09-10 NOTE — ED Notes (Signed)
Patient transported to X-ray 

## 2013-09-10 NOTE — ED Notes (Signed)
Pt with laceration to R wrist with sheet metal just PTA. He states "it looks like there was lots of blood, it soaked right through the first towel." second bandage is in place now with no active bleeding. He can wiggle digits, skin w/d, sensation intact

## 2013-09-22 NOTE — ED Provider Notes (Signed)
I saw and evaluated the patient, reviewed the resident's note and I agree with the findings and plan.   EKG Interpretation None      Patient seen by me. Right wrist laceration from stainless steel sheet metal. 4 cm jagged laceraiton without evidence of tendon, arterial, or deep structure injury. Suture repair by resident.   Vanetta MuldersScott Zackowski, MD 09/22/13 2200

## 2014-11-05 ENCOUNTER — Emergency Department (HOSPITAL_COMMUNITY)
Admission: EM | Admit: 2014-11-05 | Discharge: 2014-11-05 | Disposition: A | Discharge status: Home / Self Care | Payer: No Typology Code available for payment source | Attending: Emergency Medicine | Admitting: Emergency Medicine

## 2014-11-05 ENCOUNTER — Encounter (HOSPITAL_COMMUNITY): Payer: Self-pay | Admitting: Nurse Practitioner

## 2014-11-05 ENCOUNTER — Emergency Department (HOSPITAL_COMMUNITY): Payer: No Typology Code available for payment source

## 2014-11-05 DIAGNOSIS — I10 Essential (primary) hypertension: Secondary | ICD-10-CM | POA: Insufficient documentation

## 2014-11-05 DIAGNOSIS — Y998 Other external cause status: Secondary | ICD-10-CM | POA: Insufficient documentation

## 2014-11-05 DIAGNOSIS — E119 Type 2 diabetes mellitus without complications: Secondary | ICD-10-CM | POA: Insufficient documentation

## 2014-11-05 DIAGNOSIS — Z86010 Personal history of colonic polyps: Secondary | ICD-10-CM | POA: Insufficient documentation

## 2014-11-05 DIAGNOSIS — Z8719 Personal history of other diseases of the digestive system: Secondary | ICD-10-CM | POA: Insufficient documentation

## 2014-11-05 DIAGNOSIS — Z79899 Other long term (current) drug therapy: Secondary | ICD-10-CM | POA: Insufficient documentation

## 2014-11-05 DIAGNOSIS — G4730 Sleep apnea, unspecified: Secondary | ICD-10-CM | POA: Insufficient documentation

## 2014-11-05 DIAGNOSIS — Y9389 Activity, other specified: Secondary | ICD-10-CM | POA: Insufficient documentation

## 2014-11-05 DIAGNOSIS — W1839XA Other fall on same level, initial encounter: Secondary | ICD-10-CM | POA: Insufficient documentation

## 2014-11-05 DIAGNOSIS — Z7982 Long term (current) use of aspirin: Secondary | ICD-10-CM | POA: Insufficient documentation

## 2014-11-05 DIAGNOSIS — Y9289 Other specified places as the place of occurrence of the external cause: Secondary | ICD-10-CM | POA: Insufficient documentation

## 2014-11-05 DIAGNOSIS — E785 Hyperlipidemia, unspecified: Secondary | ICD-10-CM | POA: Insufficient documentation

## 2014-11-05 DIAGNOSIS — S20212A Contusion of left front wall of thorax, initial encounter: Secondary | ICD-10-CM | POA: Insufficient documentation

## 2014-11-05 DIAGNOSIS — G8929 Other chronic pain: Secondary | ICD-10-CM | POA: Insufficient documentation

## 2014-11-05 DIAGNOSIS — Z87891 Personal history of nicotine dependence: Secondary | ICD-10-CM | POA: Insufficient documentation

## 2014-11-05 DIAGNOSIS — R0602 Shortness of breath: Secondary | ICD-10-CM | POA: Insufficient documentation

## 2014-11-05 DIAGNOSIS — F329 Major depressive disorder, single episode, unspecified: Secondary | ICD-10-CM | POA: Insufficient documentation

## 2014-11-05 DIAGNOSIS — Z8701 Personal history of pneumonia (recurrent): Secondary | ICD-10-CM | POA: Insufficient documentation

## 2014-11-05 DIAGNOSIS — M1990 Unspecified osteoarthritis, unspecified site: Secondary | ICD-10-CM | POA: Insufficient documentation

## 2014-11-05 DIAGNOSIS — Z8619 Personal history of other infectious and parasitic diseases: Secondary | ICD-10-CM | POA: Insufficient documentation

## 2014-11-05 MED ORDER — MORPHINE SULFATE 4 MG/ML IJ SOLN
4.0000 mg | Freq: Once | INTRAMUSCULAR | Status: AC
  Administered 2014-11-05: 4 mg via INTRAMUSCULAR
  Filled 2014-11-05: qty 1

## 2014-11-05 NOTE — ED Notes (Signed)
Declined W/C at D/C and was escorted to lobby by RN. 

## 2014-11-05 NOTE — ED Provider Notes (Signed)
History  This chart was scribed for non-physician practitioner, Catha Gosselin, PA-C,working with Cathren Laine, MD, by Karle Plumber, ED Scribe. This patient was seen in room TR09C/TR09C and the patient's care was started at 10:06 AM.  Chief Complaint  Patient presents with  . Fall   The history is provided by the patient and medical records. No language interpreter was used.    HPI Comments:  [name] is a 59 y.o. male, with PMHx of chronic back pain and arthritis, who presents to the Emergency Department complaining of severe left-sided chest wall pain that began yesterday secondary to falling backwards off a segway. He reports immediate pain when he fell and states he heard a cracking noise. He states he also hit his posterior head. He denies any loss of consciousness or vomiting. He was able to roll to his side and get up on his own. He has taken Vicodin about 7 hours ago with no significant relief of the pain. Movements and deep breathing makes the pain worse. He denies alleviating factors. He denies CP, numbness, tingling or weakness of the lower extremities, nausea, vomiting, bruising or wounds. He reports allergy to Tetracycline. He takes 81 mg aspirin daily.  Past Medical History  Diagnosis Date  . Hypertension     takes Prinzide daily  . Hyperlipidemia     takes Pravastatin daily  . Sleep apnea     sleep study in epic from 2010;doesn't use CPAP  . History of bronchitis     last time about 23yrs ago  . Pneumonia     last time about 32yrs ago  . Headache(784.0)     pt states he keeps one related to cervical issues  . Weakness     and numbness to right hand  . Arthritis   . Chronic back pain   . History of gout   . Gastric ulcer   . History of colon polyps   . Diverticulosis   . Urinary frequency   . Nocturia   . Diabetes mellitus without complication     but doesn't take any meds;diet controlled  . Depression     takes Paxil daily  . History of staph infection  22yrs ago  . Carpal tunnel syndrome     left hand   Past Surgical History  Procedure Laterality Date  . Left finger surgery    . Appendectomy    . Right knee arthroscopy    . Skin cancer spot removed a month ago    . Colonoscopy    . Esophagogastroduodenoscopy    . Right thumb surgery      fused  . Right carpal tunnel surgery    . Anterior cervical decomp/discectomy fusion N/A 11/25/2012    Procedure: ANTERIOR CERVICAL DECOMPRESSION/DISCECTOMY FUSION C4- C6 2 LEVEL;  Surgeon: Venita Lick, MD;  Location: [facility] OR;  Service: Orthopedics;  Laterality: N/A;   History reviewed. No pertinent family history. Social History  Substance Use Topics  . Smoking status: Former Games developer  . Smokeless tobacco: None     Comment: quit smoking 81yrs ago  . Alcohol Use: Yes     Comment: once a month    Review of Systems  Respiratory: Positive for shortness of breath.   Cardiovascular: Negative for chest pain.  Gastrointestinal: Negative for nausea and vomiting.  Musculoskeletal: Positive for back pain and arthralgias. Negative for gait problem and neck pain.  Skin: Negative for color change and wound.  Neurological: Negative for syncope, weakness and numbness.  Allergies  Tetracyclines & related  Home Medications   Prior to Admission medications   Medication Sig Start Date End Date Taking? Authorizing Provider  aspirin 81 MG tablet Take 81 mg by mouth daily.    Historical Provider, MD  HYDROcodone-acetaminophen (NORCO) 10-325 MG per tablet Take 2 tablets by mouth every 6 (six) hours as needed for moderate pain.    Historical Provider, MD  lisinopril-hydrochlorothiazide (PRINZIDE,ZESTORETIC) 20-25 MG per tablet Take 2 tablets by mouth daily.    Historical Provider, MD  methocarbamol (ROBAXIN) 500 MG tablet Take 1 tablet (500 mg total) by mouth every 6 (six) hours as needed (spasms). 11/26/12   Naida Sleight, PA-C  PARoxetine (PAXIL) 20 MG tablet Take 20 mg by mouth daily.    Historical Provider,  MD  pravastatin (PRAVACHOL) 40 MG tablet Take 40 mg by mouth daily.    Historical Provider, MD  testosterone cypionate (DEPOTESTOTERONE CYPIONATE) 100 MG/ML injection Inject 100 mg into the muscle every 14 (fourteen) days. For IM use only    Historical Provider, MD  vitamin B-12 (CYANOCOBALAMIN) 1000 MCG tablet Take 1,000 mcg by mouth daily.    Historical Provider, MD   Triage Vitals: BP 141/79 mmHg  Pulse 89  Temp(Src) 98.5 F (36.9 C) (Oral)  Resp 17  Ht  (1.778 m)  Wt 217 lb (98.431 kg)  BMI 31.14 kg/m2  SpO2 98% Physical Exam  Constitutional: He is oriented to person, place, and time. He appears well-developed and well-nourished.  HENT:  Head: Normocephalic and atraumatic.  Eyes: Conjunctivae and EOM are normal.  Neck: Normal range of motion.  Cardiovascular: Normal rate.   Pulmonary/Chest: Effort normal and breath sounds normal. No respiratory distress. He has no wheezes. He exhibits tenderness.  No flail chest or retractions.  Musculoskeletal: Normal range of motion. He exhibits tenderness.  Posterior left sided mid rib tenderness. Pain with inspiration. Taking small breaths secondary to pain. No ecchymosis. No midline thoracic or lumbar paravertebral tenderness.  Neurological: He is alert and oriented to person, place, and time.  Skin: Skin is warm and dry.  Psychiatric: He has a normal mood and affect. His behavior is normal.  Nursing note and vitals reviewed.   ED Course  Procedures (including critical care time) DIAGNOSTIC STUDIES: Oxygen Saturation is 99% on RA, normal by my interpretation.   COORDINATION OF CARE: 10:12 AM- Will order injection of Morphine and will image left ribs. Pt verbalizes understanding and agrees to plan.  Medications  morphine 4 MG/ML injection 4 mg (4 mg Intramuscular Given 11/05/14 1103)    Labs Review Labs Reviewed - No data to display  Imaging Review Dg Ribs Unilateral W/chest Left  11/05/2014   CLINICAL DATA:  Status post  fall off a sec way yesterday.  Rib pain.  EXAM: LEFT RIBS AND CHEST - 3+ VIEW  COMPARISON:  None.  FINDINGS: No fracture or other bone lesions are seen involving the ribs. There is no evidence of pneumothorax or pleural effusion. Both lungs are clear. Heart size and mediastinal contours are within normal limits. There is moderate osteoarthritis of bilateral glenohumeral joints.  IMPRESSION: No acute osseous injury of the ribs.   Electronically Signed   By: Elige Ko   On: 11/05/2014 11:09   I, Catha Gosselin, personally reviewed and evaluated these images and lab results as part of my medical decision-making.   EKG Interpretation None      MDM   Final diagnoses:  Rib contusion, left, initial encounter  Patient presents for left-sided rib pain secondary to fall. Left rib x-ray is negative for acute fracture or pneumothorax. He states he has hydrocodone 10 mg at home. He was given an incentive spirometer. I discussed return precautions as well as follow-up with his primary care provider and he verbally agrees with the plan.   I personally performed the services described in this documentation, which was scribed in my presence. The recorded information has been reviewed and is accurate.     Catha Gosselin, PA-C 11/05/14 1129  Cathren Laine, MD 11/05/14 [phone]

## 2014-11-05 NOTE — ED Notes (Signed)
Pt fell backwards off a segway yesterday. He c/o L mid back pain that is worse with breathing and movement, lower back pain, neck pain since. He denies bowel/bladder changes. He tried vicodin this am with minimal relief. Ambulatory with pain,mae.

## 2014-11-05 NOTE — Discharge Instructions (Signed)
Chest Contusion Use incentive spirometry to avoid getting pneumonia. Follow-up with your primary care doctor. Return for difficulty breathing or worsening shortness of breath. A chest contusion is a deep bruise on your chest area. Contusions are the result of an injury that caused bleeding under the skin. A chest contusion may involve bruising of the skin, muscles, or ribs. The contusion may turn blue, purple, or yellow. Minor injuries will give you a painless contusion, but more severe contusions may stay painful and swollen for a few weeks. CAUSES  A contusion is usually caused by a blow, trauma, or direct force to an area of the body. SYMPTOMS   Swelling and redness of the injured area.  Discoloration of the injured area.  Tenderness and soreness of the injured area.  Pain. DIAGNOSIS  The diagnosis can be made by taking a history and performing a physical exam. An X-ray, CT scan, or MRI may be needed to determine if there were any associated injuries, such as broken bones (fractures) or internal injuries. TREATMENT  Often, the best treatment for a chest contusion is resting, icing, and applying cold compresses to the injured area. Deep breathing exercises may be recommended to reduce the risk of pneumonia. Over-the-counter medicines may also be recommended for pain control. HOME CARE INSTRUCTIONS   Put ice on the injured area.  Put ice in a plastic bag.  Place a towel between your skin and the bag.  Leave the ice on for 15-20 minutes, 03-04 times a day.  Only take over-the-counter or prescription medicines as directed by your caregiver. Your caregiver may recommend avoiding anti-inflammatory medicines (aspirin, ibuprofen, and naproxen) for 48 hours because these medicines may increase bruising.  Rest the injured area.  Perform deep-breathing exercises as directed by your caregiver.  Stop smoking if you smoke.  Do not lift objects over 5 pounds (2.3 kg) for 3 days or longer if  recommended by your caregiver. SEEK IMMEDIATE MEDICAL CARE IF:   You have increased bruising or swelling.  You have pain that is getting worse.  You have difficulty breathing.  You have dizziness, weakness, or fainting.  You have blood in your urine or stool.  You cough up or vomit blood.  Your swelling or pain is not relieved with medicines. MAKE SURE YOU:   Understand these instructions.  Will watch your condition.  Will get help right away if you are not doing well or get worse. Document Released: 12/04/2000 Document Revised: 12/04/2011 Document Reviewed: 09/02/2011 [facility] Patient Information 2015 [place], [place]. This information is not intended to replace advice given to you by your health care provider. Make sure you discuss any questions you have with your health care provider.

## 2014-11-05 NOTE — ED Notes (Addendum)
PT reports he hit his head but no LOC.

## 2015-04-30 IMAGING — CR DG ORBITS FOR FOREIGN BODY
2 series · 2 of 2 positions shown · non-contrast
Comparison: None.

CLINICAL DATA: Pre MRI screening possible foreign body, metal
worker

ORBITS FOR FOREIGN BODY - 2 VIEW

[w waters pa (1 of 2)]
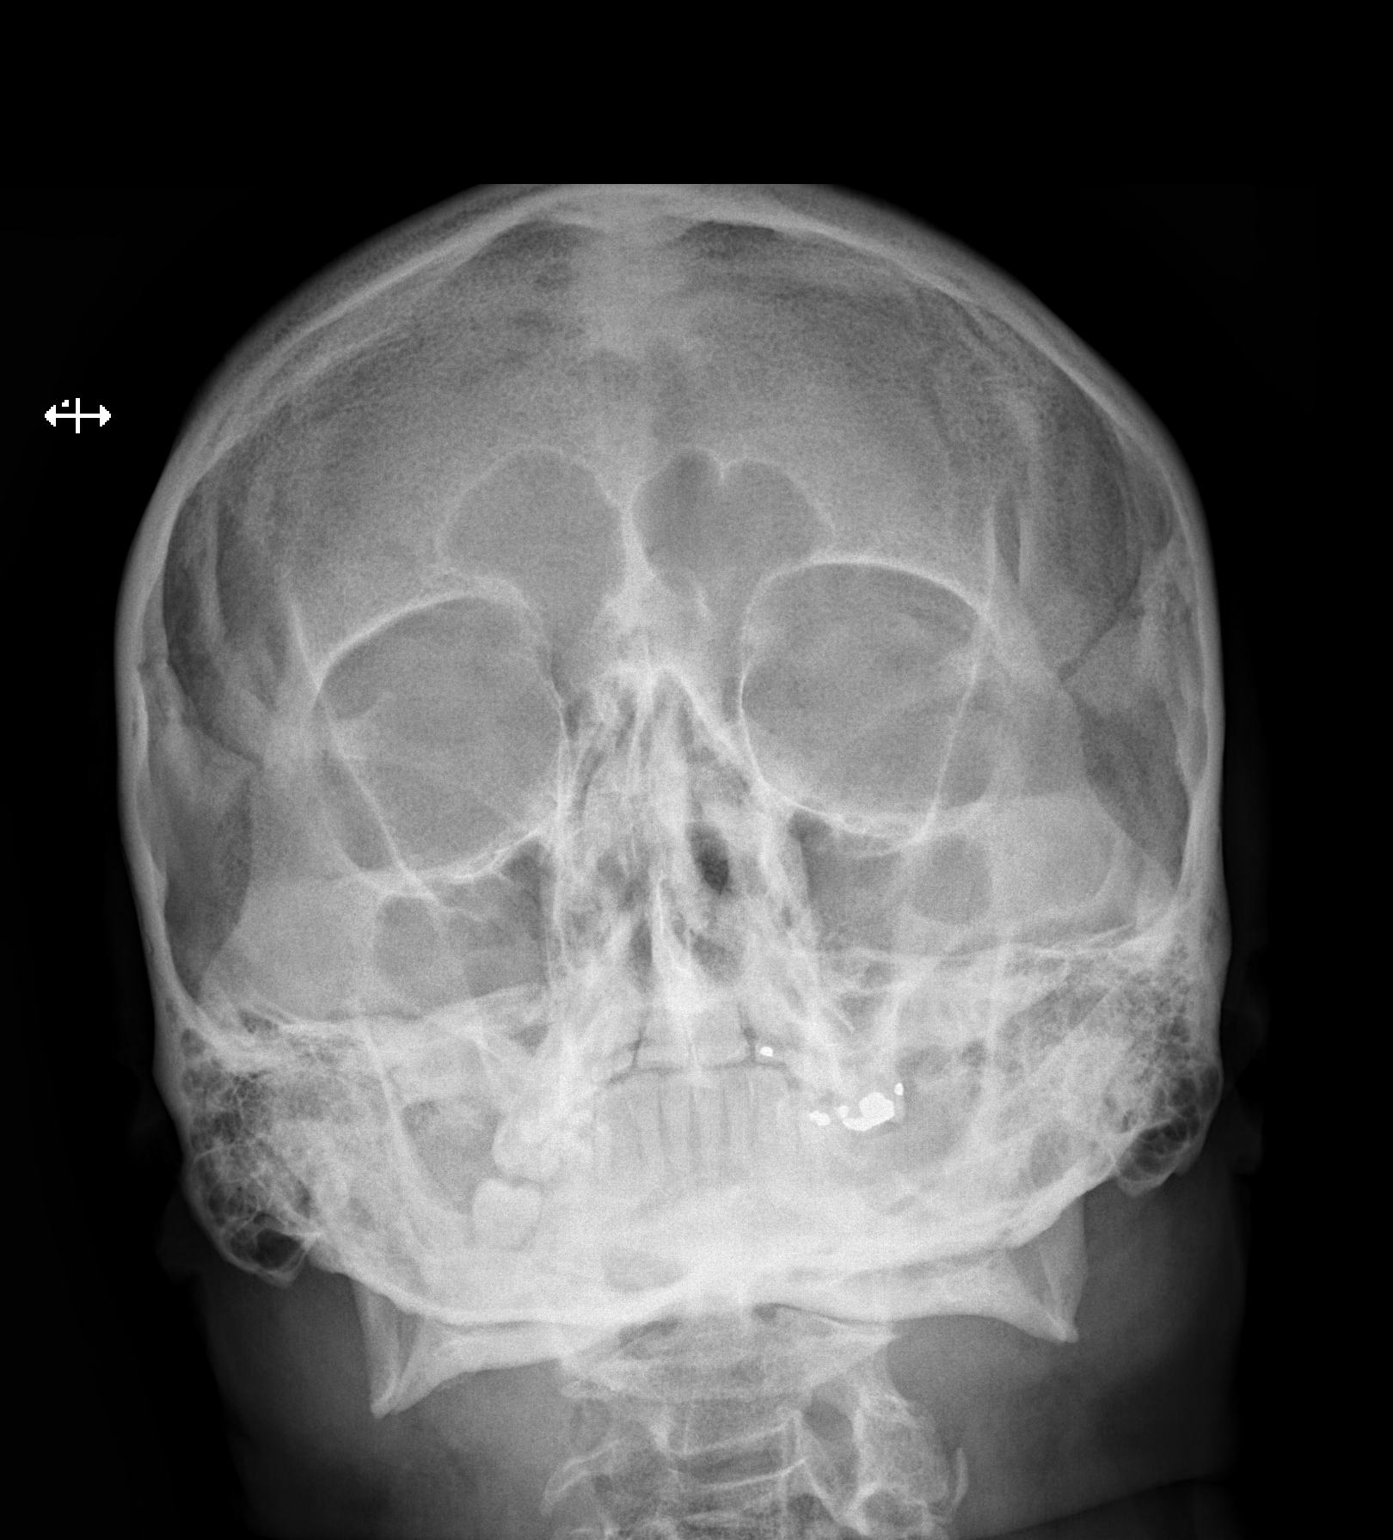

[w waters pa (2 of 2)]
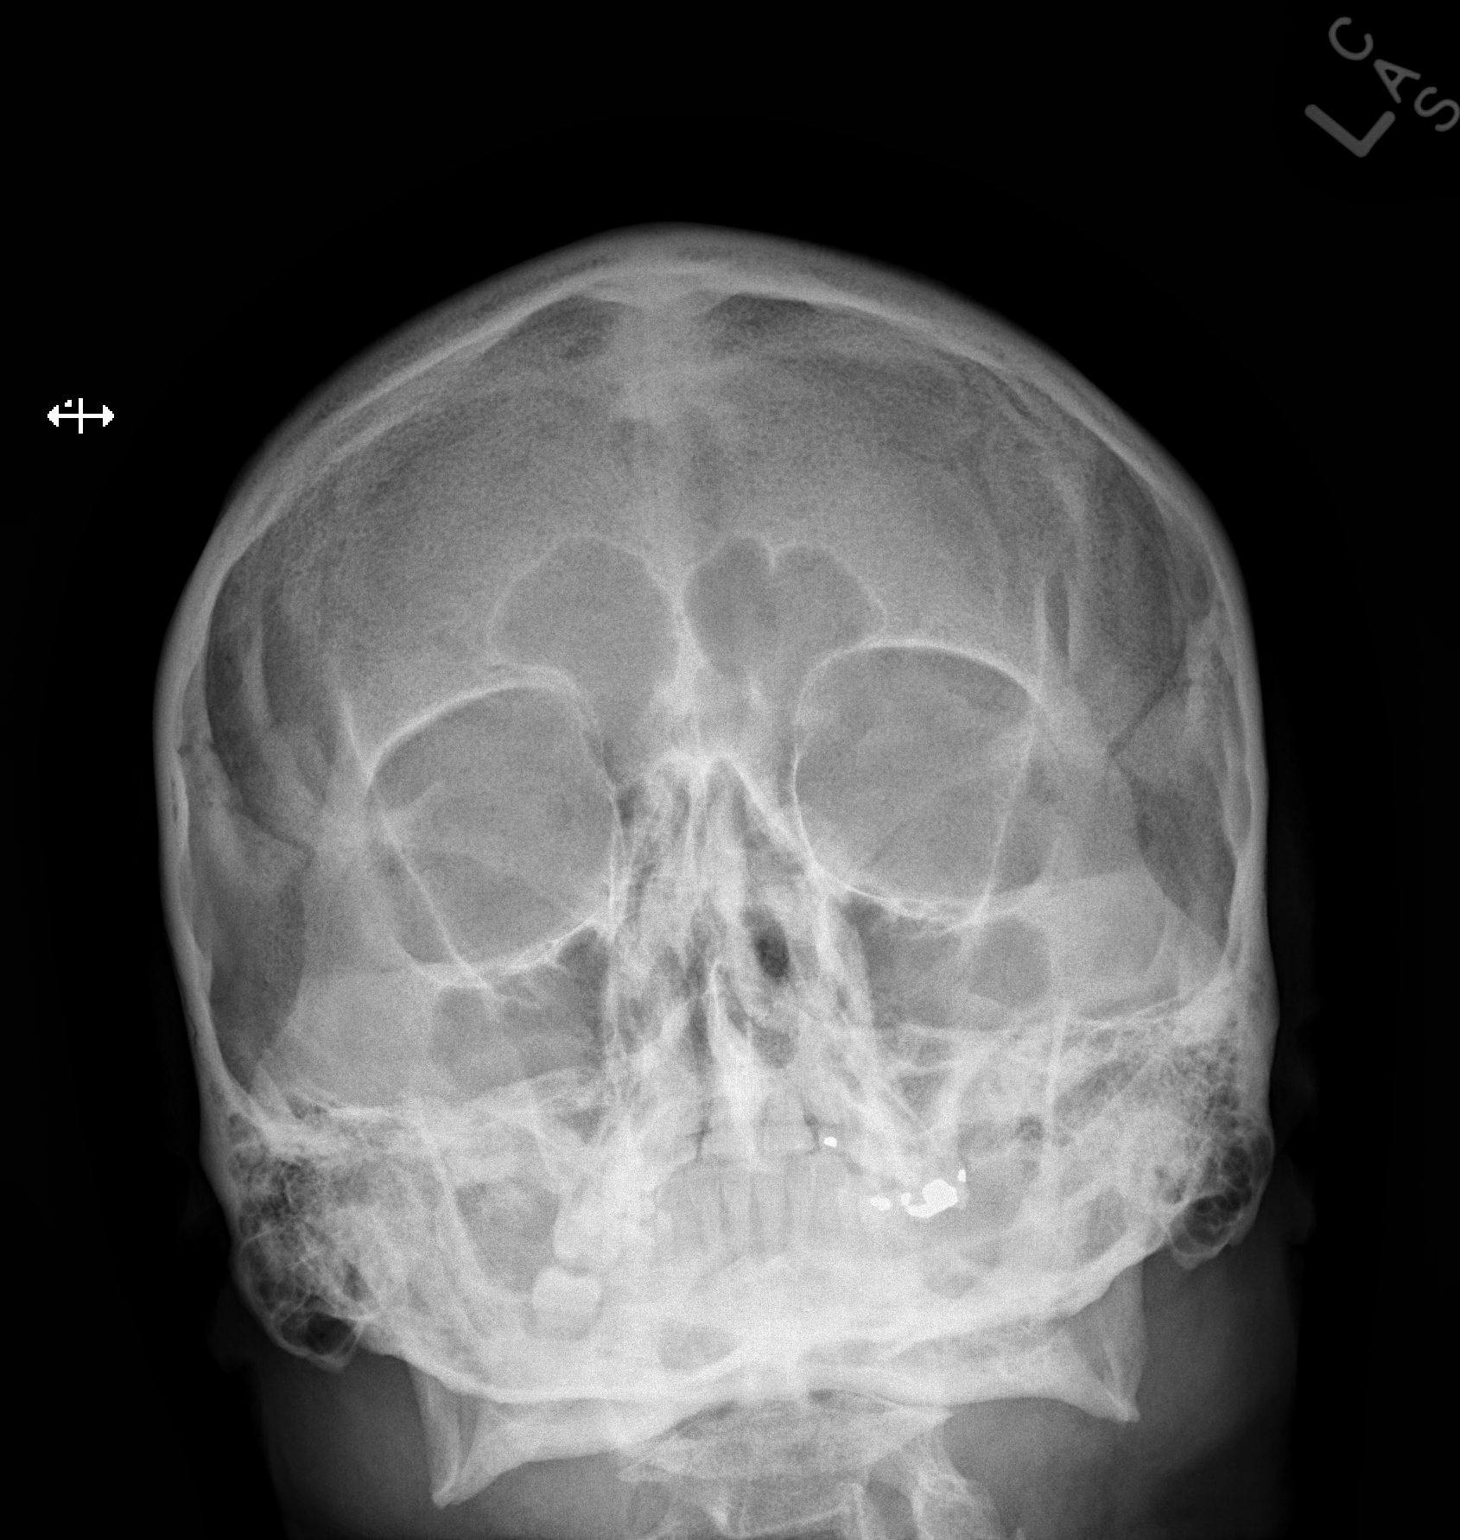

[2 of 2 positions shown; findings below may reference images not displayed]

FINDINGS: The facial bones as visualized are within normal limits.
Paranasal sinuses are unremarkable.  No radiopaque foreign body to
suggest metallic fragments are noted.
IMPRESSION: No evidence of radiopaque foreign body.

## 2017-06-18 ENCOUNTER — Encounter (HOSPITAL_COMMUNITY): Payer: Self-pay | Admitting: Emergency Medicine

## 2017-06-18 ENCOUNTER — Ambulatory Visit (HOSPITAL_COMMUNITY)
Admission: EM | Admit: 2017-06-18 | Discharge: 2017-06-18 | Disposition: A | Discharge status: Home / Self Care | Payer: Medicare Other | Attending: Family Medicine | Admitting: Family Medicine

## 2017-06-18 DIAGNOSIS — S61411A Laceration without foreign body of right hand, initial encounter: Secondary | ICD-10-CM

## 2017-06-18 MED ORDER — LIDOCAINE-EPINEPHRINE (PF) 2 %-1:200000 IJ SOLN
INTRAMUSCULAR | Status: AC
  Filled 2017-06-18: qty 20

## 2017-06-18 NOTE — Discharge Instructions (Addendum)
Keep your wound clean and as dry as possible. Do not immerse or soak your wound in water. This means no swimming, washing dishes (unless thick rubber gloves are used), baths, or hot tubs until the stitches are removed. Leave original bandages on until tomorrow morning. After this time, showering or rinsing is recommended, rather than bathing. After removing bandages, gently cleanse your wound with soap and water. Cleansing twice a day prevents buildup of debris and will result in easier suture removal. Return in 7 days for suture removal.

## 2017-06-18 NOTE — ED Triage Notes (Signed)
Patient accidentally cut left hand with a pocket knife.  described blood squirting.  When dressing and bungee cord removed, rapid bleeding resumed.

## 2017-06-18 NOTE — ED Provider Notes (Signed)
[facility]ENTER   [id number] 06/18/17 Arrival Time: 1315  ASSESSMENT & PLAN:  1. Laceration of right hand without foreign body, initial encounter    Patient brought by RN immediately to room 6 secondary to copious bleeding from R hand wound.  Procedure: Verbal consent obtained. Patient provided with risks and alternatives to the procedure. Wound copiously irrigated with NS then cleansed with betadine. Anesthetized dorsal R hand wound with 5 mL of lidocaine with epinephrine. Wound carefully explored. No foreign body, tendon injury, or nonviable tissue were noted. Using sterile technique a single running 4-0 Prolene suture placed to reapproximate the wound. Bleeding controlled. Pressure dressing applied. Patient tolerated procedure well. No complications. Patient advised to look for and return for any signs of infection such as redness, swelling, discharge, or worsening pain. Return for suture removal in 7 days.  OTC analgesics if needed.  Reviewed expectations re: course of current medical issues. Questions answered. Outlined signs and symptoms indicating need for more acute intervention. Patient verbalized understanding. After Visit Summary given.   SUBJECTIVE:  [name] is a 62 y.o. male who presents with a laceration to his R hand. Reports that he was attempting to cut a strap on his trailer when knife slipped, cutting his R hand. Self-applied bungee cord tourniquet secondary to continuous bleeding; able to slow flow. No thumb ROM loss or sensation changes. Takes ASA 81mg  daily.  Feels Td is UTD.  ROS: As per HPI.   OBJECTIVE:  Vitals:   06/18/17 1328  BP: 112/75  Pulse: 98  Resp: 20  Temp: 97.6 F (36.4 C)  TempSrc: Oral  SpO2: 96%    General appearance: alert; no distress Skin: laceration of dorsal R hand just proximal to thumb; size: approx 3 cm in length with active bleeding; no foreign body identified; no debris; R thumb with FROM and normal strength; no  tendon involvement; normal capillary refill of all fingers on R hand; normal distal sensation Psychological: alert and cooperative; normal mood and affect  No results found.  Allergies  Allergen Reactions  . Tetracyclines & Related Anaphylaxis    Past Medical History:  Diagnosis Date  . Arthritis   . Carpal tunnel syndrome    left hand  . Chronic back pain   . Depression    takes Paxil daily  . Diabetes mellitus without complication (HCC)    but doesn't take any meds;diet controlled  . Diverticulosis   . Gastric ulcer   . Headache(784.0)    pt states he keeps one related to cervical issues  . History of bronchitis    last time about 70yrs ago  . History of colon polyps   . History of gout   . History of staph infection 2yrs ago  . Hyperlipidemia    takes Pravastatin daily  . Hypertension    takes Prinzide daily  . Nocturia   . Pneumonia    last time about 49yrs ago  . Sleep apnea    sleep study in epic from 2010;doesn't use CPAP  . Urinary frequency   . Weakness    and numbness to right hand   Social History   Socioeconomic History  . Marital status: Married    Spouse name: Not on file  . Number of children: Not on file  . Years of education: Not on file  . Highest education level: Not on file  Occupational History  . Not on file  Social Needs  . Financial resource strain: Not on file  .  Food insecurity:    Worry: Not on file    Inability: Not on file  . Transportation needs:    Medical: Not on file    Non-medical: Not on file  Tobacco Use  . Smoking status: Former Games developermoker  . Tobacco comment: quit smoking 59108yrs ago  Substance and Sexual Activity  . Alcohol use: Yes    Comment: once a month  . Drug use: No  . Sexual activity: Yes  Lifestyle  . Physical activity:    Days per week: Not on file    Minutes per session: Not on file  . Stress: Not on file  Relationships  . Social connections:    Talks on phone: Not on file    Gets together: Not on  file    Attends religious service: Not on file    Active member of club or organization: Not on file    Attends meetings of clubs or organizations: Not on file    Relationship status: Not on file  Other Topics Concern  . Not on file  Social History Narrative  . Not on file         Mardella LaymanHagler, Iracema Lanagan, MD 06/18/17 [phone]

## 2017-08-06 DIAGNOSIS — M25511 Pain in right shoulder: Secondary | ICD-10-CM

## 2017-08-06 HISTORY — DX: Pain in right shoulder: M25.511

## 2017-10-01 ENCOUNTER — Emergency Department (HOSPITAL_COMMUNITY)
Admission: EM | Admit: 2017-10-01 | Discharge: 2017-10-01 | Disposition: A | Discharge status: Home / Self Care | Payer: Medicare Other | Attending: Emergency Medicine | Admitting: Emergency Medicine

## 2017-10-01 ENCOUNTER — Encounter (HOSPITAL_COMMUNITY): Payer: Self-pay

## 2017-10-01 ENCOUNTER — Ambulatory Visit (HOSPITAL_COMMUNITY)
Admission: EM | Admit: 2017-10-01 | Discharge: 2017-10-01 | Disposition: A | Discharge status: Home / Self Care | Payer: Medicare Other | Source: Home / Self Care

## 2017-10-01 ENCOUNTER — Other Ambulatory Visit: Payer: Self-pay

## 2017-10-01 ENCOUNTER — Emergency Department (HOSPITAL_COMMUNITY): Payer: Medicare Other

## 2017-10-01 DIAGNOSIS — Z79899 Other long term (current) drug therapy: Secondary | ICD-10-CM | POA: Insufficient documentation

## 2017-10-01 DIAGNOSIS — E119 Type 2 diabetes mellitus without complications: Secondary | ICD-10-CM | POA: Diagnosis not present

## 2017-10-01 DIAGNOSIS — R0789 Other chest pain: Secondary | ICD-10-CM | POA: Diagnosis not present

## 2017-10-01 DIAGNOSIS — R079 Chest pain, unspecified: Secondary | ICD-10-CM | POA: Diagnosis not present

## 2017-10-01 DIAGNOSIS — I10 Essential (primary) hypertension: Secondary | ICD-10-CM | POA: Diagnosis not present

## 2017-10-01 DIAGNOSIS — Z87891 Personal history of nicotine dependence: Secondary | ICD-10-CM | POA: Insufficient documentation

## 2017-10-01 DIAGNOSIS — M79601 Pain in right arm: Secondary | ICD-10-CM | POA: Diagnosis not present

## 2017-10-01 LAB — HEPATIC FUNCTION PANEL
ALK PHOS: 66 U/L (ref 38–126)
ALT: 75 U/L — ABNORMAL HIGH (ref 0–44)
AST: 47 U/L — ABNORMAL HIGH (ref 15–41)
Albumin: 3.9 g/dL (ref 3.5–5.0)
BILIRUBIN INDIRECT: 0.9 mg/dL (ref 0.3–0.9)
Bilirubin, Direct: 0.2 mg/dL (ref 0.0–0.2)
Total Bilirubin: 1.1 mg/dL (ref 0.3–1.2)
Total Protein: 7.1 g/dL (ref 6.5–8.1)

## 2017-10-01 LAB — CBC
HEMATOCRIT: 43.3 % (ref 39.0–52.0)
Hemoglobin: 14.5 g/dL (ref 13.0–17.0)
MCH: 33 pg (ref 26.0–34.0)
MCHC: 33.5 g/dL (ref 30.0–36.0)
MCV: 98.6 fL (ref 78.0–100.0)
PLATELETS: 210 10*3/uL (ref 150–400)
RBC: 4.39 MIL/uL (ref 4.22–5.81)
RDW: 12.9 % (ref 11.5–15.5)
WBC: 7.1 10*3/uL (ref 4.0–10.5)

## 2017-10-01 LAB — I-STAT TROPONIN, ED
Troponin i, poc: 0 ng/mL (ref 0.00–0.08)
Troponin i, poc: 0 ng/mL (ref 0.00–0.08)

## 2017-10-01 LAB — BASIC METABOLIC PANEL
Anion gap: 5 (ref 5–15)
BUN: 13 mg/dL (ref 8–23)
CO2: 27 mmol/L (ref 22–32)
CREATININE: 0.82 mg/dL (ref 0.61–1.24)
Calcium: 10 mg/dL (ref 8.9–10.3)
Chloride: 104 mmol/L (ref 98–111)
GFR calc Af Amer: >60 mL/min (ref >60)
Glucose, Bld: 164 mg/dL — ABNORMAL HIGH (ref 70–99)
POTASSIUM: 3.9 mmol/L (ref 3.5–5.1)
SODIUM: 136 mmol/L (ref 135–145)

## 2017-10-01 LAB — LIPASE, BLOOD: Lipase: 26 U/L (ref 11–51)

## 2017-10-01 LAB — D-DIMER, QUANTITATIVE (NOT AT ARMC): D-Dimer, Quant: 0.39 ug/mL-FEU (ref 0.00–0.50)

## 2017-10-01 MED ORDER — GI COCKTAIL ~~LOC~~
30.0000 mL | Freq: Once | ORAL | Status: AC
  Administered 2017-10-01: 30 mL via ORAL
  Filled 2017-10-01: qty 30

## 2017-10-01 MED ORDER — PANTOPRAZOLE SODIUM 40 MG PO TBEC
40.0000 mg | DELAYED_RELEASE_TABLET | Freq: Once | ORAL | Status: AC
  Administered 2017-10-01: 40 mg via ORAL
  Filled 2017-10-01: qty 1

## 2017-10-01 NOTE — ED Notes (Signed)
Spoke with dr hagler/reviewed ekg and chart.  Patient and wife agreed to go to ed now

## 2017-10-01 NOTE — ED Triage Notes (Signed)
Week history of right epigastric pain.  Neck pain, right arm pain.  Pain has been occurring for 2 weeks.  Sharp pain into dull.  Mild  nausea, no vomiting, and mild sob.

## 2017-10-01 NOTE — ED Triage Notes (Signed)
Pt endorses intermittent central chest pain that radiates to the right jaw and right arm with weakness, dizziness and nausea. Denies shob. VSS. Hx DM, htn.

## 2017-10-01 NOTE — ED Notes (Signed)
Pt complaining of sudden CP. MD notified, EKG ran

## 2017-10-01 NOTE — ED Notes (Signed)
ED Provider at bedside. 

## 2017-10-01 NOTE — Discharge Instructions (Signed)
Your workup for chest pain has been negative. Your EKG was normal and your heart showed no signs of damage based on your blood work. Return to the ED for worsening chest pain or shortness of breath. Please f/u with your PCP and consider a cardiology referral to workup heart disease in the outpatient setting.

## 2017-10-01 NOTE — ED Provider Notes (Signed)
[facility] EMERGENCY DEPARTMENT Provider Note   CSN: [id number] Arrival date & time: 10/01/17  1157     History   Chief Complaint Chief Complaint  Patient presents with  . Chest Pain    HPI [name] is a 62 y.o. male with a history of HTN and DM who presents with a 2-week history of intermittent chest pain. He reports that he has throbbing pain in the middle of his chest that radiates to his bilateral jaw and into his right arm. The episodes used to last a few minutes, but now they are lasting up to 20 minutes. The episodes usually occur when he is sleeping or when he is sitting down watching tv. They resolve on their own with time. He has had 2 episodes per day on average over the past 2 weeks. Before that time, he had never had chest pain before. He has tried tums and pepcid with no relief. He reports associated diaphoresis, nausea, vertigo, and fatigue. He denies dyspnea, vomiting, and leg swelling. He is currently pain-free.   Past Medical History:  Diagnosis Date  . Arthritis   . Carpal tunnel syndrome    left hand  . Chronic back pain   . Depression    takes Paxil daily  . Diabetes mellitus without complication (HCC)    but doesn't take any meds;diet controlled  . Diverticulosis   . Gastric ulcer   . Headache(784.0)    pt states he keeps one related to cervical issues  . History of bronchitis    last time about 61yrs ago  . History of colon polyps   . History of gout   . History of staph infection 76yrs ago  . Hyperlipidemia    takes Pravastatin daily  . Hypertension    takes Prinzide daily  . Nocturia   . Pneumonia    last time about 27yrs ago  . Sleep apnea    sleep study in epic from 2010;doesn't use CPAP  . Urinary frequency   . Weakness    and numbness to right hand    There are no active problems to display for this patient.   Past Surgical History:  Procedure Laterality Date  . ANTERIOR CERVICAL DECOMP/DISCECTOMY FUSION N/A  11/25/2012   Procedure: ANTERIOR CERVICAL DECOMPRESSION/DISCECTOMY FUSION C4- C6 2 LEVEL;  Surgeon: Venita Lick, MD;  Location: [facility] OR;  Service: Orthopedics;  Laterality: N/A;  . APPENDECTOMY    . COLONOSCOPY    . ESOPHAGOGASTRODUODENOSCOPY    . left finger surgery    . right carpal tunnel surgery    . right knee arthroscopy    . right thumb surgery     fused  . skin cancer spot removed a month ago          Home Medications    Prior to Admission medications   Medication Sig Start Date End Date Taking? Authorizing Provider  PARoxetine (PAXIL) 20 MG tablet Take 20 mg by mouth daily.   Yes [provider]  methocarbamol (ROBAXIN) 500 MG tablet Take 1 tablet (500 mg total) by mouth every 6 (six) hours as needed (spasms). Patient not taking: Reported on 10/01/2017 11/26/12   Naida Sleight, PA-C    Family History History reviewed. No pertinent family history.  Social History Social History   Tobacco Use  . Smoking status: Former Games developer  . Tobacco comment: quit smoking 109yrs ago  Substance Use Topics  . Alcohol use: Yes    Comment: once  a month  . Drug use: No     Allergies   Tetracyclines & related   Review of Systems Review of Systems  Constitutional: Positive for activity change, diaphoresis and fatigue. Negative for chills and fever.  Respiratory: Negative for cough and shortness of breath.   Cardiovascular: Positive for chest pain. Negative for leg swelling.  Gastrointestinal: Positive for nausea. Negative for abdominal pain, blood in stool and vomiting.  Genitourinary: Negative for difficulty urinating and dysuria.  Musculoskeletal: Positive for neck pain. Negative for back pain.  Skin: Negative for rash.       Multiple bug bites  Neurological: Negative for headaches.       Vertigo     Physical Exam Updated Vital Signs BP 114/80   Pulse 78   Temp 98 F (36.7 C) (Oral)   Resp (!) 21   Ht 5\' 10"  (1.778 m)   Wt 99.3 kg (219 lb)   SpO2 98%   BMI  31.42 kg/m   Physical Exam  Constitutional: He is oriented to person, place, and time. He appears well-developed and well-nourished. No distress.  HENT:  Head: Normocephalic and atraumatic.  Eyes: Pupils are equal, round, and reactive to light. EOM are normal.  Neck: Normal range of motion. Neck supple.  Cardiovascular: Normal rate and regular rhythm.  Pulses:      Radial pulses are 2+ on the right side, and 2+ on the left side.       Dorsalis pedis pulses are 2+ on the right side, and 2+ on the left side.       Posterior tibial pulses are 2+ on the right side, and 2+ on the left side.  Pulmonary/Chest: Effort normal and breath sounds normal. No respiratory distress.  Abdominal: Soft. Bowel sounds are normal. He exhibits no distension and no mass. There is no tenderness.  Musculoskeletal:       Right lower leg: He exhibits no edema.  Neurological: He is alert and oriented to person, place, and time.  Skin: Skin is warm and dry. Capillary refill takes less than 2 seconds. No rash noted.  Psychiatric: He has a normal mood and affect. His behavior is normal.     ED Treatments / Results  Labs (all labs ordered are listed, but only abnormal results are displayed) Labs Reviewed  BASIC METABOLIC PANEL - Abnormal; Notable for the following components:      Result Value   Glucose, Bld 164 (*)    All other components within normal limits  CBC  LIPASE, BLOOD  HEPATIC FUNCTION PANEL  I-STAT TROPONIN, ED  I-STAT TROPONIN, ED    EKG None  Radiology Dg Chest 2 View  Result Date: 10/01/2017 CLINICAL DATA:  Chest pain and shortness of breath EXAM: CHEST - 2 VIEW COMPARISON:  November 05, 2014 FINDINGS: Lungs are clear. Heart size and pulmonary vascularity are normal. No adenopathy. No pneumothorax. There is postoperative change in the lower cervical spine. There is aortic atherosclerosis. IMPRESSION: No edema or consolidation.  There is aortic atherosclerosis. Aortic Atherosclerosis  (ICD10-I70.0). Electronically Signed   By: Bretta Bang III M.D.   On: 10/01/2017 13:08    Procedures Procedures (including critical care time)  Medications Ordered in ED Medications - No data to display   Initial Impression / Assessment and Plan / ED Course  I have reviewed the triage vital signs and the nursing notes.  Pertinent labs & imaging results that were available during my care of the patient were reviewed by me  and considered in my medical decision making (see chart for details).  Meribeth MattesLoy J Minteer is a 62 y.o. male with a history of HTN and DM who presents with a 2-week history of episodic chest pain with associated nausea and fatigue. Ddx included ACS (although he has atypical chest pain and low-risk heart pathway score), PE, pancreatitis, GERD/indigestion. Upon arrival to the ED, the patient was afebrile and hemodynamically stable. Labs were significant for a negative troponin x2, d-dimer, and lipase. EKG x3 showed no changes from prior. CXR was unremarkable. Mr. Sedalia MutaCox had one more episode of pain while in the ED which resolved with a GI cocktail and pantoprazole. EKG during the pain episode was unremarkable. The workup was reassuring in ruling out ACS and PE. Patient deemed safe for discharge home and was advised about return precautions including worsening chest pain, non-resolving chest pain, and dyspnea. He was also advised to f/u with his PCP for possible cardiology referral and workup of chest pain in the outpatient setting. Recommended that the patient take over the counter tylenol or antacids for chest pain in the interim depending on what he feels is the most helpful.   Final Clinical Impressions(s) / ED Diagnoses   Final diagnoses:  None    ED Discharge Orders    None       Dorrell, Cathleen Cortieborah N, MD 10/01/17 2340    Charlynne PanderYao, David Hsienta, MD 10/02/17 [phone]

## 2017-10-07 ENCOUNTER — Encounter: Payer: Self-pay | Admitting: Cardiology

## 2017-10-07 DIAGNOSIS — E669 Obesity, unspecified: Secondary | ICD-10-CM

## 2017-10-07 DIAGNOSIS — R0789 Other chest pain: Secondary | ICD-10-CM

## 2017-10-07 DIAGNOSIS — G4733 Obstructive sleep apnea (adult) (pediatric): Secondary | ICD-10-CM

## 2017-10-07 DIAGNOSIS — I700 Atherosclerosis of aorta: Secondary | ICD-10-CM

## 2017-10-07 DIAGNOSIS — E119 Type 2 diabetes mellitus without complications: Secondary | ICD-10-CM

## 2017-10-09 ENCOUNTER — Encounter: Payer: Self-pay | Admitting: Cardiology

## 2017-10-09 ENCOUNTER — Other Ambulatory Visit: Payer: Self-pay | Admitting: Cardiology

## 2017-10-10 ENCOUNTER — Other Ambulatory Visit: Payer: Self-pay | Admitting: Cardiology

## 2017-10-10 DIAGNOSIS — R109 Unspecified abdominal pain: Secondary | ICD-10-CM

## 2017-10-17 ENCOUNTER — Ambulatory Visit
Admission: RE | Admit: 2017-10-17 | Discharge: 2017-10-17 | Disposition: A | Discharge status: Home / Self Care | Payer: Medicare Other | Source: Ambulatory Visit | Attending: Cardiology | Admitting: Cardiology

## 2017-10-17 DIAGNOSIS — R109 Unspecified abdominal pain: Secondary | ICD-10-CM

## 2017-10-22 ENCOUNTER — Ambulatory Visit
Admission: RE | Admit: 2017-10-22 | Discharge: 2017-10-22 | Disposition: A | Discharge status: Home / Self Care | Payer: Medicare Other | Source: Ambulatory Visit | Attending: Cardiology | Admitting: Cardiology

## 2017-10-29 ENCOUNTER — Ambulatory Visit: Payer: Self-pay | Admitting: Physician Assistant

## 2017-11-04 ENCOUNTER — Encounter: Payer: Self-pay | Admitting: Cardiology

## 2018-01-06 ENCOUNTER — Encounter: Payer: Self-pay | Admitting: Cardiology

## 2018-01-06 DIAGNOSIS — R0789 Other chest pain: Secondary | ICD-10-CM | POA: Insufficient documentation

## 2018-01-06 DIAGNOSIS — E669 Obesity, unspecified: Secondary | ICD-10-CM

## 2018-01-06 DIAGNOSIS — E785 Hyperlipidemia, unspecified: Secondary | ICD-10-CM | POA: Insufficient documentation

## 2018-01-06 DIAGNOSIS — E119 Type 2 diabetes mellitus without complications: Secondary | ICD-10-CM | POA: Insufficient documentation

## 2018-01-06 DIAGNOSIS — I700 Atherosclerosis of aorta: Secondary | ICD-10-CM | POA: Insufficient documentation

## 2018-01-06 DIAGNOSIS — G4730 Sleep apnea, unspecified: Secondary | ICD-10-CM | POA: Insufficient documentation

## 2018-01-06 HISTORY — DX: Atherosclerosis of aorta: I70.0

## 2018-01-06 HISTORY — DX: Obesity, unspecified: E66.9

## 2018-01-06 NOTE — Procedures (Signed)
  ONE DAY STRESS MYOVIEW  DATE: 10/09/2017   MRN: [id number]  PATIENT:  [name] (05-23-1955)    62 yo  M      Wgt : 221    Hgt : 67 in   REFERRING:  Dr. Windle Guard  INDICATIONS: chest pain   Risk factors:  HL, BP, DM  PROCEDURE: The patient received an intravenous resting injection of 10.9 millicuries of Tc 5m Tetrofosmin with SPECT acquisition done after an adequate delay.  He then exercised on the standard Bruce protocol a total of 6:36 minutes into Bruce Stage II achieving a workload of 8 METS. The test was stopped due to dyspnea and fatigue. He had no angina. Resting heart rate is 70 bpm.  Peak heart rate is 144 bpm, which was 91% of predicted. BP response is normal and rose from 122/68 to 160/70.  At peak exercise, he received a repeat intravenous injection of 30.0 millicuries of Tc 24m Tetrofosmin with SPECT acquisition done after an adequate delay.  He tolerated the procedure well.  EKG DATA: Resting EKG is normal at rest.  With exercise he exceeded his target heart rate and had no ST depression c/w ischemia.  SCINTIGRAPHIC DATA:   The quality of the study is good. Review of data in cine format shows no significant motion.  No significant soft tissue attenuation noted. TID = 0.91.  EDV =80 cc.  ESV = 26 cc. Perfusion imaging shows normal myocardial perfusion with no evidence of ischemia or infarction.  Quantitative gated SPECT analysis shows an ejection fraction of 67% with normal wall motion and wall thickening.  IMPRESSION:   1. Normal stress Myoview study with no evidence of ischemia or infarction 2. Normal quantitative gated SPECT ejection fraction of 67% with normal wall motion and wall thickening.  Recommendations:  No evidence of ischemia but reduced exercise tolerance for age.  Evaluation for other cause of chest pain.    Darden Palmer. MD [facility]

## 2018-01-06 NOTE — Progress Notes (Signed)
Closson, Simpson    Date of visit:  11/04/2017 DOB:  09-May-1955    Age:  62 yrs. Medical record number:  [id number]     Account number:  [id number] Primary Care Provider: Windle Guard ____________________________ CURRENT DIAGNOSES  1. Chest pain, unspecified  2. Abdominal pain  3. Fatigue  4. Essential hypertension  5. Type 2 diabetes mellitus without complications  6. Hyperlipidemia  7. Sleep apnea  8. Obesity ____________________________ ALLERGIES  Tetracycline, Hives and/or rash ____________________________ MEDICATIONS  1. hydrocodone 10 mg-acetaminophen 325 mg tablet, Q 4 hrs PRN  2. hyoscyamine ER 0.375 mg tablet,extended release,12 hr, 1.5 p.o. b.i.d.  3. lisinopril 20 mg-hydrochlorothiazide 25 mg tablet, 1 p.o. daily  4. lovastatin 20 mg tablet, 1 p.o. daily  5. pantoprazole 40 mg tablet,delayed release, 1 p.o. daily  6. Paxil 40 mg tablet, 1 p.o. daily ____________________________ HISTORY OF PRESENT ILLNESS Patient returns for cardiac followup. He feels as if he is getting better and that the symptoms he was having in his chest and abdomen have been settling down. He had a myocardial perfusion scan that showed no EKG changes with ischemia although his exercise tolerance was reduced. He did not have any symptoms with exercise and had no evidence of ischemia on his images. An abdominal ultrasound did not show gallstones and he had no evidence of an aneurysm. He denies PND, orthopnea, syncope, or claudication. He has been taking pantoprazole and feels as if things are improving and resolving. ____________________________ PAST HISTORY  Past Medical Illnesses:  hypertension, hyperlipidemia, DM-non-insulin dependent, carpal tunnel syndrome, depression, gastric ulcer, colon polyps, gout, sleep apnea;  Cardiovascular Illnesses:  no previous history of cardiac disease;  Infectious Diseases:  no previous history of significant infectious diseases;  Surgical Procedures:  shoulder surgery-rt,  arthroscopic knee surgery, cervical laminectomy, carpal tunnel release, hand surgery, appendectomy;  Trauma History:  no previous history of significant trauma;  NYHA Classification:  I;  Canadian Angina Classification:  Class 0: Asymptomatic;  Cardiology Procedures-Invasive:  no previous interventional or invasive cardiology procedures;  Cardiology Procedures-Noninvasive:  treadmill Myoview July 2019;  Peripheral Vascular Procedures:  no previous invasive peripheral vascular procedures.;  LVEF of % documented via nuclear study on 10/09/2017,   ____________________________ CARDIO-PULMONARY TEST DATES EKG Date:  10/07/2016;  Nuclear Study Date:  10/09/2017;   ____________________________ FAMILY HISTORY Brother -- Alive and well Brother -- Armed forces training and education officer and well Brother -- Armed forces training and education officer and well Brother -- COPD Brother -- Armed forces training and education officer and well Father -- Father dead, Medical history unknown Mother -- Mother dead, Pacemaker in situ Sister -- Heart disease ____________________________ SOCIAL HISTORY Alcohol Use:  socially;  Smoking:  used to smoke but quit 2001;  Diet:  regular diet;  Lifestyle:  married;  Exercise:  no regular exercise;  Occupation:  disabled;  Residence:  lives with wife;   ____________________________ REVIEW OF SYSTEMS General:  denies recent weight change, fatique or change in exercise tolerance.  Integumentary:rash and itches Eyes: wears eye glasses/contact lenses Ears, Nose, Throat, Mouth:  epistaxis Respiratory: denies dyspnea, cough, wheezing or hemoptysis. Cardiovascular:  please review HPI Abdominal: denies dyspepsia, GI bleeding, constipation, or diarrhea Genitourinary-Male: erectile dysfunction, tried Viagra but no longer uses  Musculoskeletal:  chronic low back pain, generalized arthritis Neurological:  headaches Psychiatric:  depression Hematological/Immunologic:  denies any food allergies, bleeding disorders. ____________________________ PHYSICAL EXAMINATION VITAL SIGNS  Blood Pressure:   130/88 Sitting, Left arm, regular cuff  , 124/87 Standing, Left arm and regular cuff   Pulse:  72/min. Weight:  221.00  lbs. Height:  67.00"BMI: 34  Constitutional:  pleasant white male in no acute distress, moderately obese Skin:  warm and dry to touch, no apparent skin lesions, or masses noted. Head:  normocephalic, normal hair pattern, no masses or tenderness Neck:  supple, without massess. No JVD, thyromegaly or carotid bruits. Carotid upstroke normal. Chest:  normal symmetry, clear to auscultation. Cardiac:  regular rhythm, normal S1 and S2, No S3 or S4, no murmurs, gallops or rubs detected. Extremities & Back:  no deformities, clubbing, cyanosis, erythema or edema observed. Normal muscle strength and tone. Neurological:  no gross motor or sensory deficits noted, affect appropriate, oriented x3. ___________________________ IMPRESSIONS/PLAN  1. Somewhat atypical chest pain that appears to be improving with no evidence of ischemia 2. Aortic atherosclerosis 3. Type 2 diabetes 4. Hypertension  Recommendations:  Symptoms are atypical and he had no evidence of ischemia on stress testing. Things seem to be improving but since he is still symptomatic I will see him back in 2 months and I encouraged him to increase his activity if possible. He is to let us know if symptoms worsen. ____________________________ TODAYS ORDERS  1. Return Visit: 2 months                       ____________________________ Cardiology Physician:  Darden Palmer MD [facility]

## 2018-01-06 NOTE — Consult Note (Signed)
[name] [name], [name]    Date of visit:  10/07/2017 DOB:  [birth year]    Age:  62 yrs. Medical record number:  [id number]     Account number:  [id number] Primary Care Provider: Windle Guard ____________________________ CURRENT DIAGNOSES  1. Chest pain, unspecified  2. Abdominal pain  3. Fatigue  4. Essential hypertension  5. Type 2 diabetes mellitus without complications  6. Hyperlipidemia  7. Sleep apnea  8. Obesity ____________________________ ALLERGIES  Tetracycline, Hives and/or rash ____________________________ MEDICATIONS  1. hydrocodone 10 mg-acetaminophen 325 mg tablet, Q 4 hrs PRN  2. hyoscyamine ER 0.375 mg tablet,extended release,12 hr, 1.5 p.o. b.i.d.  3. lisinopril 20 mg-hydrochlorothiazide 25 mg tablet, 1 p.o. daily  4. lovastatin 20 mg tablet, 1 p.o. daily  5. pantoprazole 40 mg tablet,delayed release, 1 p.o. daily  6. Paxil 40 mg tablet, 1 p.o. daily ____________________________ HISTORY OF PRESENT ILLNESS This very nice 62 year old male is seen at the request of Dr. Jeannetta Nap because of chest pain. The patient has a previous history of diabetes mellitus but not currently on medication, hyperlipidemia and hypertension. He has been obese for a number of years. For about 3 weeks he has had chest discomfort. He describes the chest discomfort sharp epigastric discomfort that radiates up into his neck and down and to his right arm. The discomfort is not associated with exertion or with food. It is not relieved with antacids or with TUMS and can last anywhere from 5 minutes to one hour. He has had a couple dozen or so episodes of this discomfort over the past 3 weeks usually occurring at rest. On July 10 he went to an urgent care and was later sent to the [facility] emergency room where troponins were negative. A chest x-ray showed aortic atherosclerosis and his EKG was unremarkable. The note says his symptoms were relieved with a GI cocktail but the patient states that they really were not  affected by the GI cocktail. He was referred for further cardiac evaluation. He is currently on disability due to shoulder issues and low back pain. He denies PND, orthopnea or edema. He does not have syncope or significant palpitations. In addition he complains of a significant amount of fatigue over the past 3 weeks that has been overwhelming. ____________________________ PAST HISTORY  Past Medical Illnesses:  hypertension, hyperlipidemia, DM-non-insulin dependent, carpal tunnel syndrome, depression, gastric ulcer, colon polyps, gout, sleep apnea;  Cardiovascular Illnesses:  no previous history of cardiac disease;  Infectious Diseases:  no previous history of significant infectious diseases;  Surgical Procedures:  shoulder surgery-rt, arthroscopic knee surgery, cervical laminectomy, carpal tunnel release, hand surgery, appendectomy;  Trauma History:  no previous history of significant trauma;  NYHA Classification:  I;  Canadian Angina Classification:  Class 0: Asymptomatic;  Cardiology Procedures-Invasive:  no previous interventional or invasive cardiology procedures;  Cardiology Procedures-Noninvasive:  no previous non-invasive cardiovascular testing;  Peripheral Vascular Procedures:  no previous invasive peripheral vascular procedures.;  LVEF not documented,   ____________________________ CARDIO-PULMONARY TEST DATES ____________________________ FAMILY HISTORY Brother -- Alive and well Brother -- Armed forces training and education officer and well Brother -- Armed forces training and education officer and well Brother -- COPD Brother -- Armed forces training and education officer and well Father -- Father dead, Medical history unknown Mother -- Mother dead, Pacemaker in situ Sister -- Heart disease ____________________________ SOCIAL HISTORY Alcohol Use:  socially;  Smoking:  used to smoke but quit 2001;  Diet:  regular diet;  Lifestyle:  married;  Exercise:  no regular exercise;  Occupation:  disabled;  Residence:  lives with wife;  ____________________________ REVIEW OF SYSTEMS General:  denies  recent weight change, fatique or change in exercise tolerance.  Integumentary:rash and itches Eyes: wears eye glasses/contact lenses Ears, Nose, Throat, Mouth:  epistaxis Respiratory: denies dyspnea, cough, wheezing or hemoptysis. Cardiovascular:  please review HPI Abdominal: denies dyspepsia, GI bleeding, constipation, or diarrhea Genitourinary-Male: erectile dysfunction, tried Viagra but no longer uses  Musculoskeletal:  chronic low back pain, generalized arthritis Neurological:  headaches Psychiatric:  depression Hematological/Immunologic:  denies any food allergies, bleeding disorders. ____________________________ PHYSICAL EXAMINATION VITAL SIGNS  Blood Pressure:  100/80 Sitting, Left arm, large cuff  , 100/70 Standing, Left arm and large cuff   Pulse:  84/min. Weight:  221.00 lbs. Height:  67"BMI: 34  Constitutional:  pleasant white male in no acute distress, moderately obese Skin:  warm and dry to touch, no apparent skin lesions, or masses noted. Head:  normocephalic, normal hair pattern, no masses or tenderness Eyes:  EOMS Intact, PERRLA, C and S clear, Funduscopic exam not done. ENT:  ears, nose and throat reveal no gross abnormalities.  Dentition good. Neck:  supple, without massess. No JVD, thyromegaly or carotid bruits. Carotid upstroke normal. Chest:  normal symmetry, clear to auscultation. Cardiac:  regular rhythm, normal S1 and S2, No S3 or S4, no murmurs, gallops or rubs detected. Abdomen:  abdomen soft,non-tender, no masses, no hepatospenomegaly, or aneurysm noted Peripheral Pulses:  the femoral,dorsalis pedis, and posterior tibial pulses are full and equal bilaterally with no bruits auscultated. Extremities & Back:  no deformities, clubbing, cyanosis, erythema or edema observed. Normal muscle strength and tone. Neurological:  no gross motor or sensory deficits noted, affect appropriate, oriented x3. ___________________________ IMPRESSIONS/PLAN 1. Chest discomfort with atypical  features associated with fatigue 2. Hypertension 3. Obesity 4. Hyperlipidemia under treatment 5. History of diabetes  Recommendations:  Chest discomfort as atypical features but the fatigue and crescendo nature is concerning. I recommended that he have a myocardial perfusion scan as well as a gallbladder ultrasound. Twelve-lead EKG personally reviewed by me today is normal. Emergency room records were reviewed recently. In addition asked him to begin patoprazole 40 mg daily.   Greater than 60 minutes spent with patient including greater than 50% of the time spent in coordination of care review records in counseling regarding chest pain with multiple risk factors.  ____________________________ TODAYS ORDERS  1. 12 Lead EKG: Today  2. Treadmill 1 day Cardiolite: First Available  3. Abdominal Ultrasound: At Patient Convenience                       ____________________________ Cardiology Physician:  Darden Palmer MD [facility]

## 2018-01-21 ENCOUNTER — Encounter: Payer: Self-pay | Admitting: *Deleted

## 2018-01-26 ENCOUNTER — Ambulatory Visit: Payer: Self-pay | Admitting: Cardiology

## 2018-06-03 ENCOUNTER — Encounter: Payer: Self-pay | Admitting: Internal Medicine

## 2018-12-24 ENCOUNTER — Other Ambulatory Visit: Payer: Self-pay | Admitting: Family Medicine

## 2018-12-24 ENCOUNTER — Ambulatory Visit
Admission: RE | Admit: 2018-12-24 | Discharge: 2018-12-24 | Disposition: A | Discharge status: Home / Self Care | Payer: Medicare Other | Source: Ambulatory Visit | Attending: Family Medicine | Admitting: Family Medicine

## 2018-12-24 DIAGNOSIS — Z01818 Encounter for other preprocedural examination: Secondary | ICD-10-CM

## 2019-01-13 NOTE — H&P (Signed)
Patient's anticipated LOS is less than 2 midnights, meeting these requirements: - Younger than 31 - Lives within 1 hour of care - Has a competent adult at home to recover with post-op recover - NO history of  - Chronic pain requiring opiods  - Diabetes  - Coronary Artery Disease  - Heart failure  - Heart attack  - Stroke  - DVT/VTE  - Cardiac arrhythmia  - Respiratory Failure/COPD  - Renal failure  - Anemia  - Advanced Liver disease       [name] is an 63 y.o. male.    Chief Complaint: right shoulder pain  HPI: Pt is a 63 y.o. male complaining of right shoulder pain for multiple years. Pain had continually increased since the beginning. X-rays in the clinic show end-stage arthritic changes of the right shoulder. Pt has tried various conservative treatments which have failed to alleviate their symptoms, including injections and therapy. Various options are discussed with the patient. Risks, benefits and expectations were discussed with the patient. Patient understand the risks, benefits and expectations and wishes to proceed with surgery.   PCP:  Kaleen Mask, MD  D/C Plans: Home  PMH: Past Medical History:  Diagnosis Date  . Aortic atherosclerosis (HCC) 01/06/2018  . Carpal tunnel syndrome    left hand  . Chest pain, atypical 01/06/2018   Myocardial perfusion scan 10/09/17 EF 67%, no ischemia, reduced exercise tolerance   . Chronic back pain   . Depression    takes Paxil daily  . Diabetes mellitus without complication (HCC)    but doesn't take any meds;diet controlled  . Diverticulosis   . Gastric ulcer   . History of bronchitis    last time about 52yrs ago  . History of colon polyps   . History of gout   . History of staph infection 40yrs ago  . Hyperlipidemia    takes Pravastatin daily  . Hypertension    takes Prinzide daily  . Obesity (BMI 30-39.9) 01/06/2018  . Pain in joint of right shoulder 08/06/2017  . Pneumonia    last time about 62yrs ago   . Sleep apnea    sleep study in epic from 2010;doesn't use CPAP    PSH: Past Surgical History:  Procedure Laterality Date  . ANTERIOR CERVICAL DECOMP/DISCECTOMY FUSION N/A 11/25/2012   Procedure: ANTERIOR CERVICAL DECOMPRESSION/DISCECTOMY FUSION C4- C6 2 LEVEL;  Surgeon: Venita Lick, MD;  Location: [facility] OR;  Service: Orthopedics;  Laterality: N/A;  . APPENDECTOMY    . COLONOSCOPY    . ESOPHAGOGASTRODUODENOSCOPY    . left finger surgery    . right carpal tunnel surgery    . right knee arthroscopy    . right thumb surgery     fused  . SHOULDER SURGERY Right   . skin cancer spot removed a month ago      Social History:  reports that he quit smoking about 19 years ago. He does not have any smokeless tobacco history on file. He reports current alcohol use. He reports that he does not use drugs.  Allergies:  Allergies  Allergen Reactions  . Tetracyclines & Related Anaphylaxis    Medications: No current facility-administered medications for this encounter.    Current Outpatient Medications  Medication Sig Dispense Refill  . amphetamine-dextroamphetamine (ADDERALL) 20 MG tablet Take 20 mg by mouth daily.    Marland Kitchen glipiZIDE (GLUCOTROL XL) 10 MG 24 hr tablet Take 20 mg by mouth daily with breakfast.    . lisinopril-hydrochlorothiazide (PRINZIDE,ZESTORETIC)  20-25 MG tablet Take 1 tablet by mouth daily.    . Melatonin 10 MG TABS Take 10 mg by mouth at bedtime as needed (sleep).    . metFORMIN (GLUCOPHAGE) 1000 MG tablet Take 1,000-1,500 mg by mouth See admin instructions. 1000 mg in the morning, 1500 mg at dinner    . oxyCODONE-acetaminophen (PERCOCET) 10-325 MG tablet Take 1 tablet by mouth every 6 (six) hours as needed for pain.    . pantoprazole (PROTONIX) 20 MG tablet Take 20 mg by mouth daily.     Marland Kitchen PARoxetine (PAXIL) 40 MG tablet Take 40 mg by mouth every morning.    . simvastatin (ZOCOR) 20 MG tablet Take 20 mg by mouth at bedtime.      No results found for this or any previous  visit (from the past 48 hour(s)). No results found.  ROS: Pain with rom of the right upper extremity  Physical Exam: Alert and oriented 63 y.o. male in no acute distress Cranial nerves 2-12 intact Cervical spine: full rom with no tenderness, nv intact distally Chest: active breath sounds bilaterally, no wheeze rhonchi or rales Heart: regular rate and rhythm, no murmur Abd: non tender non distended with active bowel sounds Hip is stable with rom  Right shoulder painful rom Crepitus with rom   Assessment/Plan Assessment: end stage osteoarthritis right shoulder  Plan:  Patient will undergo a right total shoulder by Dr. Veverly Fells at [facility]. Risks benefits and expectations were discussed with the patient. Patient understand risks, benefits and expectations and wishes to proceed. Preoperative templating of the joint replacement has been completed, documented, and submitted to the Operating Room personnel in order to optimize intra-operative equipment management.   Merla Riches PA-C, MPAS [facility] is now [employer] [address] [place], [place], [place] [address] Phone: [phone] [email or web address] Facebook  [employer]

## 2019-01-13 NOTE — Patient Instructions (Addendum)
DUE TO COVID-19 ONLY ONE VISITOR IS ALLOWED TO COME WITH YOU AND STAY IN THE WAITING ROOM ONLY DURING PRE OP AND PROCEDURE DAY OF SURGERY. THE 1 VISITOR MAY VISIT WITH YOU AFTER SURGERY IN YOUR PRIVATE ROOM DURING VISITING HOURS ONLY! 10/27 YOU NEED TO HAVE A COVID 19 TEST ON_10/27_____ @_10 :05______, THIS TEST MUST BE DONE BEFORE SURGERY, COME  [address], [place] , .  [facility]) ONCE YOUR COVID TEST IS COMPLETED, PLEASE BEGIN THE QUARANTINE INSTRUCTIONS AS OUTLINED IN YOUR HANDOUT.                [name]   Your procedure is scheduled on: 01/22/19   Report to [facility] Main  Entrance   Report to Short stay 5:30  AM     Call this number if you have problems the morning of surgery [phone]      . BRUSH YOUR TEETH MORNING OF SURGERY AND RINSE YOUR MOUTH OUT, NO CHEWING GUM CANDY OR MINTS.   Do not eat food After Midnight.   YOU MAY HAVE CLEAR LIQUIDS FROM MIDNIGHT UNTIL 4:30AM.   At 4:30AM Please finish the prescribed Pre-Surgery Gatorade drink.   Nothing by mouth after you finish the Gatorade drink !    Take these medicines the morning of surgery with A SIP OF WATER: Paxil, Pantoprozole  DO NOT TAKE ANY DIABETIC MEDICATIONS DAY OF YOUR SURGERY     How to Manage Your Diabetes Before and After Surgery  Why is it important to control my blood sugar before and after surgery? . Improving blood sugar levels before and after surgery helps healing and can limit problems. . A way of improving blood sugar control is eating a healthy diet by: o  Eating less sugar and carbohydrates o  Increasing activity/exercise o  Talking with your doctor about reaching your blood sugar goals . High blood sugars (greater than 180 mg/dL) can raise your risk of infections and slow your recovery, so you will need to focus on controlling your diabetes during the weeks before surgery. . Make sure that the doctor who takes care of your diabetes knows  about your planned surgery including the date and location.  How do I manage my blood sugar before surgery? . Check your blood sugar at least 4 times a day, starting 2 days before surgery, to make sure that the level is not too high or low. o Check your blood sugar the morning of your surgery when you wake up and every 2 hours until you get to the Short Stay unit. . If your blood sugar is less than 70 mg/dL, you will need to treat for low blood sugar: o Do not take insulin. o Treat a low blood sugar (less than 70 mg/dL) with  cup of clear juice (cranberry or apple), 4 glucose tablets, OR glucose gel. o Recheck blood sugar in 15 minutes after treatment (to make sure it is greater than 70 mg/dL). If your blood sugar is not greater than 70 mg/dL on recheck, call [phone] for further instructions. . Report your blood sugar to the short stay nurse when you get to Short Stay.  . If you are admitted to the hospital after surgery: o Your blood sugar will be checked by the staff and you will probably be given insulin after surgery (instead of oral diabetes medicines) to make sure you have good blood sugar levels. o The goal for blood sugar control after surgery is 80-180  mg/dL.                You may not have any metal on your body including piercings             Do not wear jewelry,lotions, powders or  deodorant               Men may shave face and neck.   Do not bring valuables to the hospital. [place].  Contacts, dentures or bridgework may not be worn into surgery.                  Please read over the following fact sheets you were given: _____________________________________________________________________             [facility] - Preparing for Surgery  Before surgery, you can play an important role.   Because skin is not sterile, your skin needs to be as free of germs as possible.   You can reduce the number of germs on your  skin by washing with CHG (chlorahexidine gluconate) soap before surgery.   CHG is an antiseptic cleaner which kills germs and bonds with the skin to continue killing germs even after washing. Please DO NOT use if you have an allergy to CHG or antibacterial soaps .  If your skin becomes reddened/irritated stop using the CHG and inform your nurse when you arrive at Short Stay. You may shave your face/neck.  Please follow these instructions carefully:  1.  Shower with CHG Soap the night before surgery and the  morning of Surgery.  2.  If you choose to wash your hair, wash your hair first as usual with your  normal  shampoo.  3.  After you shampoo, rinse your hair and body thoroughly to remove the  shampoo.                                        4.  Use CHG as you would any other liquid soap.  You can apply chg directly  to the skin and wash                       Gently with a scrungie or clean washcloth.  5.  Apply the CHG Soap to your body ONLY FROM THE NECK DOWN.   Do not use on face/ open                           Wound or open sores. Avoid contact with eyes, ears mouth and genitals (private parts).                       Wash face,  Genitals (private parts) with your normal soap.             6.  Wash thoroughly, paying special attention to the area where your surgery  will be performed.  7.  Thoroughly rinse your body with warm water from the neck down.  8.  DO NOT shower/wash with your normal soap after using and rinsing off  the CHG Soap.                9.  Pat yourself dry with a clean towel.  10.  Wear clean pajamas.            11.  Place clean sheets on your bed the night of your first shower and do not  sleep with pets. Day of Surgery : Do not apply any lotions/deodorants the morning of surgery.  Please wear clean clothes to the hospital/surgery center.  FAILURE TO FOLLOW THESE INSTRUCTIONS MAY RESULT IN THE CANCELLATION OF YOUR SURGERY PATIENT  SIGNATURE_________________________________  NURSE SIGNATURE__________________________________  ________________________________________________________________________   [name]  An incentive spirometer is a tool that can help keep your lungs clear and active. This tool measures how well you are filling your lungs with each breath. Taking long deep breaths may help reverse or decrease the chance of developing breathing (pulmonary) problems (especially infection) following:  A long period of time when you are unable to move or be active. BEFORE THE PROCEDURE   If the spirometer includes an indicator to show your best effort, your nurse or respiratory therapist will set it to a desired goal.  If possible, sit up straight or lean slightly forward. Try not to slouch.  Hold the incentive spirometer in an upright position. INSTRUCTIONS FOR USE  1. Sit on the edge of your bed if possible, or sit up as far as you can in bed or on a chair. 2. Hold the incentive spirometer in an upright position. 3. Breathe out normally. 4. Place the mouthpiece in your mouth and seal your lips tightly around it. 5. Breathe in slowly and as deeply as possible, raising the piston or the ball toward the top of the column. 6. Hold your breath for 3-5 seconds or for as long as possible. Allow the piston or ball to fall to the bottom of the column. 7. Remove the mouthpiece from your mouth and breathe out normally. 8. Rest for a few seconds and repeat Steps 1 through 7 at least 10 times every 1-2 hours when you are awake. Take your time and take a few normal breaths between deep breaths. 9. The spirometer may include an indicator to show your best effort. Use the indicator as a goal to work toward during each repetition. 10. After each set of 10 deep breaths, practice coughing to be sure your lungs are clear. If you have an incision (the cut made at the time of surgery), support your incision when coughing  by placing a pillow or rolled up towels firmly against it. Once you are able to get out of bed, walk around indoors and cough well. You may stop using the incentive spirometer when instructed by your caregiver.  RISKS AND COMPLICATIONS  Take your time so you do not get dizzy or light-headed.  If you are in pain, you may need to take or ask for pain medication before doing incentive spirometry. It is harder to take a deep breath if you are having pain. AFTER USE  Rest and breathe slowly and easily.  It can be helpful to keep track of a log of your progress. Your caregiver can provide you with a simple table to help with this. If you are using the spirometer at home, follow these instructions: [place] IF:   You are having difficultly using the spirometer.  You have trouble using the spirometer as often as instructed.  Your pain medication is not giving enough relief while using the spirometer.  You develop fever of 100.5 F (38.1 C) or higher. SEEK IMMEDIATE MEDICAL CARE IF:   You cough up bloody  sputum that had not been present before.  You develop fever of 102 F (38.9 C) or greater.  You develop worsening pain at or near the incision site. MAKE SURE YOU:   Understand these instructions.  Will watch your condition.  Will get help right away if you are not doing well or get worse. Document Released: 07/22/2006 Document Revised: 06/03/2011 Document Reviewed: 09/22/2006 [facility] Patient Information 2014 [place], [place].   ________________________________________________________________________

## 2019-01-14 ENCOUNTER — Encounter (HOSPITAL_COMMUNITY)
Admission: RE | Admit: 2019-01-14 | Discharge: 2019-01-14 | Disposition: A | Discharge status: Home / Self Care | Payer: Medicare Other | Source: Ambulatory Visit | Attending: Orthopedic Surgery | Admitting: Orthopedic Surgery

## 2019-01-14 ENCOUNTER — Other Ambulatory Visit: Payer: Self-pay

## 2019-01-14 ENCOUNTER — Encounter (HOSPITAL_COMMUNITY): Payer: Self-pay

## 2019-01-14 DIAGNOSIS — M19011 Primary osteoarthritis, right shoulder: Secondary | ICD-10-CM | POA: Diagnosis not present

## 2019-01-14 DIAGNOSIS — Z01812 Encounter for preprocedural laboratory examination: Secondary | ICD-10-CM | POA: Insufficient documentation

## 2019-01-14 DIAGNOSIS — I10 Essential (primary) hypertension: Secondary | ICD-10-CM | POA: Diagnosis not present

## 2019-01-14 LAB — BASIC METABOLIC PANEL
Anion gap: 8 (ref 5–15)
BUN: 25 mg/dL — ABNORMAL HIGH (ref 8–23)
CO2: 25 mmol/L (ref 22–32)
Calcium: 9.6 mg/dL (ref 8.9–10.3)
Chloride: 102 mmol/L (ref 98–111)
Creatinine, Ser: 0.88 mg/dL (ref 0.61–1.24)
GFR calc Af Amer: >60 mL/min (ref >60)
GFR calc non Af Amer: >60 mL/min (ref >60)
Glucose, Bld: 124 mg/dL — ABNORMAL HIGH (ref 70–99)
Potassium: 4 mmol/L (ref 3.5–5.1)
Sodium: 135 mmol/L (ref 135–145)

## 2019-01-14 LAB — CBC
HCT: 43.1 % (ref 39.0–52.0)
Hemoglobin: 14.1 g/dL (ref 13.0–17.0)
MCH: 33.2 pg (ref 26.0–34.0)
MCHC: 32.7 g/dL (ref 30.0–36.0)
MCV: 101.4 fL — ABNORMAL HIGH (ref 80.0–100.0)
Platelets: 219 10*3/uL (ref 150–400)
RBC: 4.25 MIL/uL (ref 4.22–5.81)
RDW: 12.7 % (ref 11.5–15.5)
WBC: 9.1 10*3/uL (ref 4.0–10.5)
nRBC: 0 % (ref 0.0–0.2)

## 2019-01-14 LAB — SURGICAL PCR SCREEN
MRSA, PCR: NEGATIVE
Staphylococcus aureus: NEGATIVE

## 2019-01-14 LAB — GLUCOSE, CAPILLARY: Glucose-Capillary: 123 mg/dL — ABNORMAL HIGH (ref 70–99)

## 2019-01-14 LAB — HEMOGLOBIN A1C
Hgb A1c MFr Bld: 5.8 % — ABNORMAL HIGH (ref 4.8–5.6)
Mean Plasma Glucose: 119.76 mg/dL

## 2019-01-14 NOTE — Progress Notes (Addendum)
PCP - Dr. Arelia Sneddon Cardiologist - Dr. Wynonia Lawman  Chest x-ray - 12/24/18 EKG - 101/20 Stress Test - no ECHO - no Cardiac Cath - no  Sleep Study - yes CPAP - no.   Fasting Blood Sugar - 117-140 Checks Blood Sugar _____ times a day every 3 days  Blood Thinner Instructions:NA Aspirin Instructions: Last Dose:  Anesthesia review:   Patient denies shortness of breath, fever, cough and chest pain at PAT appointment yes  Patient verbalized understanding of instructions that were given to them at the PAT appointment. Patient was also instructed that they will need to review over the PAT instructions again at home before surgery. yes

## 2019-01-19 ENCOUNTER — Other Ambulatory Visit (HOSPITAL_COMMUNITY)
Admission: RE | Admit: 2019-01-19 | Discharge: 2019-01-19 | Disposition: A | Discharge status: Home / Self Care | Payer: Medicare Other | Source: Ambulatory Visit | Attending: Orthopedic Surgery | Admitting: Orthopedic Surgery

## 2019-01-19 DIAGNOSIS — Z01812 Encounter for preprocedural laboratory examination: Secondary | ICD-10-CM | POA: Insufficient documentation

## 2019-01-19 DIAGNOSIS — Z20828 Contact with and (suspected) exposure to other viral communicable diseases: Secondary | ICD-10-CM | POA: Insufficient documentation

## 2019-01-20 LAB — NOVEL CORONAVIRUS, NAA (HOSP ORDER, SEND-OUT TO REF LAB; TAT 18-24 HRS): SARS-CoV-2, NAA: NOT DETECTED

## 2019-01-22 ENCOUNTER — Inpatient Hospital Stay (HOSPITAL_COMMUNITY): Payer: Medicare Other | Admitting: Certified Registered"

## 2019-01-22 ENCOUNTER — Encounter (HOSPITAL_COMMUNITY): Payer: Self-pay

## 2019-01-22 ENCOUNTER — Other Ambulatory Visit: Payer: Self-pay

## 2019-01-22 ENCOUNTER — Encounter (HOSPITAL_COMMUNITY)
Admission: RE | Disposition: A | Discharge status: Home / Self Care | Payer: Self-pay | Source: Other Acute Inpatient Hospital | Attending: Orthopedic Surgery

## 2019-01-22 ENCOUNTER — Inpatient Hospital Stay (HOSPITAL_COMMUNITY): Payer: Medicare Other

## 2019-01-22 ENCOUNTER — Inpatient Hospital Stay (HOSPITAL_COMMUNITY)
Admission: RE | Admit: 2019-01-22 | Discharge: 2019-01-23 | DRG: 483 | Disposition: A | Discharge status: Home / Self Care | Payer: Medicare Other | Source: Other Acute Inpatient Hospital | Attending: Orthopedic Surgery | Admitting: Orthopedic Surgery

## 2019-01-22 DIAGNOSIS — E119 Type 2 diabetes mellitus without complications: Secondary | ICD-10-CM | POA: Diagnosis present

## 2019-01-22 DIAGNOSIS — M109 Gout, unspecified: Secondary | ICD-10-CM | POA: Diagnosis present

## 2019-01-22 DIAGNOSIS — Z8701 Personal history of pneumonia (recurrent): Secondary | ICD-10-CM | POA: Diagnosis not present

## 2019-01-22 DIAGNOSIS — Z96611 Presence of right artificial shoulder joint: Secondary | ICD-10-CM

## 2019-01-22 DIAGNOSIS — M19011 Primary osteoarthritis, right shoulder: Secondary | ICD-10-CM | POA: Diagnosis present

## 2019-01-22 DIAGNOSIS — Z6830 Body mass index (BMI) 30.0-30.9, adult: Secondary | ICD-10-CM

## 2019-01-22 DIAGNOSIS — Z8619 Personal history of other infectious and parasitic diseases: Secondary | ICD-10-CM | POA: Diagnosis not present

## 2019-01-22 DIAGNOSIS — E785 Hyperlipidemia, unspecified: Secondary | ICD-10-CM | POA: Diagnosis present

## 2019-01-22 DIAGNOSIS — Z20828 Contact with and (suspected) exposure to other viral communicable diseases: Secondary | ICD-10-CM | POA: Diagnosis present

## 2019-01-22 DIAGNOSIS — Z87891 Personal history of nicotine dependence: Secondary | ICD-10-CM

## 2019-01-22 DIAGNOSIS — M25711 Osteophyte, right shoulder: Secondary | ICD-10-CM | POA: Diagnosis present

## 2019-01-22 DIAGNOSIS — G4730 Sleep apnea, unspecified: Secondary | ICD-10-CM | POA: Diagnosis present

## 2019-01-22 DIAGNOSIS — Z881 Allergy status to other antibiotic agents status: Secondary | ICD-10-CM

## 2019-01-22 DIAGNOSIS — Z8711 Personal history of peptic ulcer disease: Secondary | ICD-10-CM

## 2019-01-22 DIAGNOSIS — K5790 Diverticulosis of intestine, part unspecified, without perforation or abscess without bleeding: Secondary | ICD-10-CM | POA: Diagnosis present

## 2019-01-22 DIAGNOSIS — I10 Essential (primary) hypertension: Secondary | ICD-10-CM | POA: Diagnosis present

## 2019-01-22 DIAGNOSIS — Z79899 Other long term (current) drug therapy: Secondary | ICD-10-CM | POA: Diagnosis not present

## 2019-01-22 DIAGNOSIS — Z87892 Personal history of anaphylaxis: Secondary | ICD-10-CM

## 2019-01-22 DIAGNOSIS — Z79890 Hormone replacement therapy (postmenopausal): Secondary | ICD-10-CM

## 2019-01-22 DIAGNOSIS — F329 Major depressive disorder, single episode, unspecified: Secondary | ICD-10-CM | POA: Diagnosis present

## 2019-01-22 DIAGNOSIS — Z7984 Long term (current) use of oral hypoglycemic drugs: Secondary | ICD-10-CM

## 2019-01-22 HISTORY — PX: TOTAL SHOULDER ARTHROPLASTY: SHX126

## 2019-01-22 LAB — GLUCOSE, CAPILLARY
Glucose-Capillary: 81 mg/dL (ref 70–99)
Glucose-Capillary: 157 mg/dL — ABNORMAL HIGH (ref 70–99)
Glucose-Capillary: 170 mg/dL — ABNORMAL HIGH (ref 70–99)
Glucose-Capillary: 150 mg/dL — ABNORMAL HIGH (ref 70–99)

## 2019-01-22 SURGERY — ARTHROPLASTY, SHOULDER, TOTAL
Anesthesia: General | Site: Shoulder | Laterality: Right

## 2019-01-22 MED ORDER — PAROXETINE HCL 20 MG PO TABS
40.0000 mg | ORAL_TABLET | ORAL | Status: DC
  Administered 2019-01-23: 40 mg via ORAL
  Filled 2019-01-22: qty 2

## 2019-01-22 MED ORDER — DEXAMETHASONE SODIUM PHOSPHATE 10 MG/ML IJ SOLN
INTRAMUSCULAR | Status: AC
  Filled 2019-01-22: qty 1

## 2019-01-22 MED ORDER — METHOCARBAMOL 500 MG IVPB - SIMPLE MED
500.0000 mg | Freq: Four times a day (QID) | INTRAVENOUS | Status: DC | PRN
  Filled 2019-01-22: qty 50

## 2019-01-22 MED ORDER — MIDAZOLAM HCL 2 MG/2ML IJ SOLN
INTRAMUSCULAR | Status: AC
  Filled 2019-01-22: qty 2

## 2019-01-22 MED ORDER — BUPIVACAINE-EPINEPHRINE 0.25% -1:200000 IJ SOLN
INTRAMUSCULAR | Status: AC
  Filled 2019-01-22: qty 1

## 2019-01-22 MED ORDER — PANTOPRAZOLE SODIUM 20 MG PO TBEC
20.0000 mg | DELAYED_RELEASE_TABLET | Freq: Every day | ORAL | Status: DC
  Administered 2019-01-23: 20 mg via ORAL
  Filled 2019-01-22: qty 1

## 2019-01-22 MED ORDER — METOCLOPRAMIDE HCL 5 MG PO TABS: 5.0000 mg | ORAL_TABLET | Freq: Three times a day (TID) | ORAL | Status: DC | PRN

## 2019-01-22 MED ORDER — METFORMIN HCL 500 MG PO TABS
1000.0000 mg | ORAL_TABLET | Freq: Every day | ORAL | Status: DC
  Administered 2019-01-23: 1000 mg via ORAL
  Filled 2019-01-22: qty 2

## 2019-01-22 MED ORDER — ONDANSETRON HCL 4 MG/2ML IJ SOLN
INTRAMUSCULAR | Status: AC
  Filled 2019-01-22: qty 2

## 2019-01-22 MED ORDER — PHENOL 1.4 % MT LIQD: 1.0000 | OROMUCOSAL | Status: DC | PRN

## 2019-01-22 MED ORDER — ONDANSETRON HCL 4 MG/2ML IJ SOLN: 4.0000 mg | Freq: Four times a day (QID) | INTRAMUSCULAR | Status: DC | PRN

## 2019-01-22 MED ORDER — HYDROCHLOROTHIAZIDE 25 MG PO TABS
25.0000 mg | ORAL_TABLET | Freq: Every day | ORAL | Status: DC
  Administered 2019-01-22 – 2019-01-23 (×2): 25 mg via ORAL
  Filled 2019-01-22 (×2): qty 1

## 2019-01-22 MED ORDER — LIDOCAINE 2% (20 MG/ML) 5 ML SYRINGE
INTRAMUSCULAR | Status: AC
  Filled 2019-01-22: qty 5

## 2019-01-22 MED ORDER — OXYCODONE-ACETAMINOPHEN 5-325 MG PO TABS
1.0000 | ORAL_TABLET | Freq: Four times a day (QID) | ORAL | Status: DC | PRN
  Administered 2019-01-22 – 2019-01-23 (×3): 1 via ORAL
  Filled 2019-01-22 (×3): qty 1

## 2019-01-22 MED ORDER — SUCCINYLCHOLINE CHLORIDE 200 MG/10ML IV SOSY
PREFILLED_SYRINGE | INTRAVENOUS | Status: DC | PRN
  Administered 2019-01-22: 120 mg via INTRAVENOUS

## 2019-01-22 MED ORDER — CHLORHEXIDINE GLUCONATE 4 % EX LIQD: 60.0000 mL | Freq: Once | CUTANEOUS | Status: DC

## 2019-01-22 MED ORDER — SIMVASTATIN 20 MG PO TABS
20.0000 mg | ORAL_TABLET | Freq: Every day | ORAL | Status: DC
  Administered 2019-01-22: 20 mg via ORAL
  Filled 2019-01-22: qty 1

## 2019-01-22 MED ORDER — BUPIVACAINE-EPINEPHRINE (PF) 0.25% -1:200000 IJ SOLN
INTRAMUSCULAR | Status: DC | PRN
  Administered 2019-01-22: 15 mL

## 2019-01-22 MED ORDER — CEFAZOLIN SODIUM-DEXTROSE 2-4 GM/100ML-% IV SOLN
2.0000 g | Freq: Four times a day (QID) | INTRAVENOUS | Status: AC
  Administered 2019-01-22 – 2019-01-23 (×3): 2 g via INTRAVENOUS
  Filled 2019-01-22 (×3): qty 100

## 2019-01-22 MED ORDER — LISINOPRIL 20 MG PO TABS
20.0000 mg | ORAL_TABLET | Freq: Every day | ORAL | Status: DC
  Administered 2019-01-23: 20 mg via ORAL
  Filled 2019-01-22: qty 1

## 2019-01-22 MED ORDER — DEXAMETHASONE SODIUM PHOSPHATE 10 MG/ML IJ SOLN
INTRAMUSCULAR | Status: DC | PRN
  Administered 2019-01-22: 8 mg via INTRAVENOUS

## 2019-01-22 MED ORDER — LIDOCAINE 2% (20 MG/ML) 5 ML SYRINGE
INTRAMUSCULAR | Status: DC | PRN
  Administered 2019-01-22: 60 mg via INTRAVENOUS

## 2019-01-22 MED ORDER — INSULIN ASPART 100 UNIT/ML ~~LOC~~ SOLN
4.0000 [IU] | Freq: Three times a day (TID) | SUBCUTANEOUS | Status: DC
  Administered 2019-01-22: 4 [IU] via SUBCUTANEOUS

## 2019-01-22 MED ORDER — SODIUM CHLORIDE 0.9 % IV SOLN
INTRAVENOUS | Status: DC
  Administered 2019-01-22: 13:00:00 via INTRAVENOUS

## 2019-01-22 MED ORDER — ONDANSETRON HCL 4 MG PO TABS: 4.0000 mg | ORAL_TABLET | Freq: Four times a day (QID) | ORAL | Status: DC | PRN

## 2019-01-22 MED ORDER — GLIPIZIDE ER 5 MG PO TB24
20.0000 mg | ORAL_TABLET | Freq: Every day | ORAL | Status: DC
  Administered 2019-01-23: 20 mg via ORAL
  Filled 2019-01-22: qty 4

## 2019-01-22 MED ORDER — ONDANSETRON HCL 4 MG/2ML IJ SOLN
INTRAMUSCULAR | Status: DC | PRN
  Administered 2019-01-22: 4 mg via INTRAVENOUS

## 2019-01-22 MED ORDER — SODIUM CHLORIDE 0.9 % IR SOLN
Status: DC | PRN
  Administered 2019-01-22: 1000 mL

## 2019-01-22 MED ORDER — PHENYLEPHRINE HCL-NACL 10-0.9 MG/250ML-% IV SOLN
INTRAVENOUS | Status: DC | PRN
  Administered 2019-01-22: 25 ug/min via INTRAVENOUS

## 2019-01-22 MED ORDER — MELATONIN 5 MG PO TABS
10.0000 mg | ORAL_TABLET | Freq: Every evening | ORAL | Status: DC | PRN
  Administered 2019-01-22: 10 mg via ORAL
  Filled 2019-01-22: qty 2

## 2019-01-22 MED ORDER — SUCCINYLCHOLINE CHLORIDE 200 MG/10ML IV SOSY
PREFILLED_SYRINGE | INTRAVENOUS | Status: AC
  Filled 2019-01-22: qty 10

## 2019-01-22 MED ORDER — MENTHOL 3 MG MT LOZG: 1.0000 | LOZENGE | OROMUCOSAL | Status: DC | PRN

## 2019-01-22 MED ORDER — LISINOPRIL-HYDROCHLOROTHIAZIDE 20-25 MG PO TABS: 1.0000 | ORAL_TABLET | Freq: Every day | ORAL | Status: DC

## 2019-01-22 MED ORDER — BISACODYL 10 MG RE SUPP: 10.0000 mg | Freq: Every day | RECTAL | Status: DC | PRN

## 2019-01-22 MED ORDER — METHOCARBAMOL 500 MG PO TABS
500.0000 mg | ORAL_TABLET | Freq: Four times a day (QID) | ORAL | Status: DC | PRN
  Administered 2019-01-22 – 2019-01-23 (×3): 500 mg via ORAL
  Filled 2019-01-22 (×3): qty 1

## 2019-01-22 MED ORDER — METHOCARBAMOL 500 MG PO TABS: 500.0000 mg | ORAL_TABLET | Freq: Four times a day (QID) | ORAL | 1 refills | Status: DC | PRN

## 2019-01-22 MED ORDER — PHENYLEPHRINE 40 MCG/ML (10ML) SYRINGE FOR IV PUSH (FOR BLOOD PRESSURE SUPPORT)
PREFILLED_SYRINGE | INTRAVENOUS | Status: AC
  Filled 2019-01-22: qty 10

## 2019-01-22 MED ORDER — PROPOFOL 10 MG/ML IV BOLUS
INTRAVENOUS | Status: AC
  Filled 2019-01-22: qty 20

## 2019-01-22 MED ORDER — METFORMIN HCL 500 MG PO TABS
1500.0000 mg | ORAL_TABLET | Freq: Every day | ORAL | Status: DC
  Administered 2019-01-22: 17:00:00 1500 mg via ORAL
  Filled 2019-01-22: qty 3

## 2019-01-22 MED ORDER — PROPOFOL 10 MG/ML IV BOLUS
INTRAVENOUS | Status: DC | PRN
  Administered 2019-01-22: 180 mg via INTRAVENOUS
  Administered 2019-01-22: 20 mg via INTRAVENOUS

## 2019-01-22 MED ORDER — FENTANYL CITRATE (PF) 100 MCG/2ML IJ SOLN
INTRAMUSCULAR | Status: DC | PRN
  Administered 2019-01-22 (×2): 50 ug via INTRAVENOUS

## 2019-01-22 MED ORDER — INSULIN ASPART 100 UNIT/ML ~~LOC~~ SOLN
0.0000 [IU] | Freq: Three times a day (TID) | SUBCUTANEOUS | Status: DC
  Administered 2019-01-22: 3 [IU] via SUBCUTANEOUS

## 2019-01-22 MED ORDER — OXYCODONE HCL 5 MG PO TABS
5.0000 mg | ORAL_TABLET | Freq: Four times a day (QID) | ORAL | Status: DC | PRN
  Administered 2019-01-22: 5 mg via ORAL
  Filled 2019-01-22: qty 1

## 2019-01-22 MED ORDER — PHENYLEPHRINE 40 MCG/ML (10ML) SYRINGE FOR IV PUSH (FOR BLOOD PRESSURE SUPPORT)
PREFILLED_SYRINGE | INTRAVENOUS | Status: DC | PRN
  Administered 2019-01-22 (×4): 120 ug via INTRAVENOUS

## 2019-01-22 MED ORDER — CEFAZOLIN SODIUM-DEXTROSE 2-4 GM/100ML-% IV SOLN
2.0000 g | INTRAVENOUS | Status: AC
  Administered 2019-01-22: 2 g via INTRAVENOUS
  Filled 2019-01-22: qty 100

## 2019-01-22 MED ORDER — METOCLOPRAMIDE HCL 5 MG/ML IJ SOLN: 5.0000 mg | Freq: Three times a day (TID) | INTRAMUSCULAR | Status: DC | PRN

## 2019-01-22 MED ORDER — BUPIVACAINE LIPOSOME 1.3 % IJ SUSP
INTRAMUSCULAR | Status: DC | PRN
  Administered 2019-01-22: 10 mL via PERINEURAL

## 2019-01-22 MED ORDER — MIDAZOLAM HCL 2 MG/2ML IJ SOLN
INTRAMUSCULAR | Status: DC | PRN
  Administered 2019-01-22: 2 mg via INTRAVENOUS

## 2019-01-22 MED ORDER — AMPHETAMINE-DEXTROAMPHETAMINE 10 MG PO TABS
20.0000 mg | ORAL_TABLET | Freq: Every day | ORAL | Status: DC
  Administered 2019-01-23: 20 mg via ORAL
  Filled 2019-01-22: qty 2

## 2019-01-22 MED ORDER — THROMBIN (RECOMBINANT) 5000 UNITS EX SOLR
CUTANEOUS | Status: AC
  Filled 2019-01-22: qty 5000

## 2019-01-22 MED ORDER — POLYETHYLENE GLYCOL 3350 17 G PO PACK: 17.0000 g | PACK | Freq: Every day | ORAL | Status: DC | PRN

## 2019-01-22 MED ORDER — LACTATED RINGERS IV SOLN
INTRAVENOUS | Status: DC
  Administered 2019-01-22 (×2): via INTRAVENOUS

## 2019-01-22 MED ORDER — BUPIVACAINE HCL (PF) 0.5 % IJ SOLN
INTRAMUSCULAR | Status: DC | PRN
  Administered 2019-01-22: 10 mL

## 2019-01-22 MED ORDER — INSULIN ASPART 100 UNIT/ML ~~LOC~~ SOLN: 0.0000 [IU] | Freq: Every day | SUBCUTANEOUS | Status: DC

## 2019-01-22 MED ORDER — FENTANYL CITRATE (PF) 100 MCG/2ML IJ SOLN
INTRAMUSCULAR | Status: AC
  Filled 2019-01-22: qty 2

## 2019-01-22 MED ORDER — THROMBIN 5000 UNITS EX SOLR
CUTANEOUS | Status: DC | PRN
  Administered 2019-01-22: 5000 [IU] via TOPICAL

## 2019-01-22 MED ORDER — OXYCODONE-ACETAMINOPHEN 10-325 MG PO TABS: 1.0000 | ORAL_TABLET | Freq: Four times a day (QID) | ORAL | Status: DC | PRN

## 2019-01-22 MED ORDER — DOCUSATE SODIUM 100 MG PO CAPS
100.0000 mg | ORAL_CAPSULE | Freq: Two times a day (BID) | ORAL | Status: DC
  Administered 2019-01-22 – 2019-01-23 (×2): 100 mg via ORAL
  Filled 2019-01-22 (×2): qty 1

## 2019-01-22 SURGICAL SUPPLY — 59 items
BAG ZIPLOCK 12X15 (MISCELLANEOUS) IMPLANT
BIT DRILL 1.6MX128 (BIT) ×2 IMPLANT
BIT DRILL 1.6MX128MM (BIT) ×1
BLADE SAG 18X100X1.27 (BLADE) ×3 IMPLANT
BODY PROX SHOULDER SZ 14-135 (Shoulder) ×2 IMPLANT
CEMENT HV SMART SET (Cement) ×3 IMPLANT
CLOSURE WOUND 1/2 X4 (GAUZE/BANDAGES/DRESSINGS) ×1
COVER BACK TABLE 60X90IN (DRAPES) ×3 IMPLANT
COVER SURGICAL LIGHT HANDLE (MISCELLANEOUS) ×3 IMPLANT
COVER WAND RF STERILE (DRAPES) IMPLANT
DECANTER SPIKE VIAL GLASS SM (MISCELLANEOUS) ×3 IMPLANT
DRAPE ORTHO SPLIT 77X108 STRL (DRAPES) ×4
DRAPE SHEET LG 3/4 BI-LAMINATE (DRAPES) ×3 IMPLANT
DRAPE SURG ORHT 6 SPLT 77X108 (DRAPES) ×2 IMPLANT
DRAPE U-SHAPE 47X51 STRL (DRAPES) ×3 IMPLANT
DRSG ADAPTIC 3X8 NADH LF (GAUZE/BANDAGES/DRESSINGS) ×3 IMPLANT
DRSG PAD ABDOMINAL 8X10 ST (GAUZE/BANDAGES/DRESSINGS) ×3 IMPLANT
DURAPREP 26ML APPLICATOR (WOUND CARE) ×3 IMPLANT
ELECT BLADE TIP CTD 4 INCH (ELECTRODE) ×3 IMPLANT
ELECT NDL TIP 2.8 STRL (NEEDLE) ×1 IMPLANT
ELECT NEEDLE TIP 2.8 STRL (NEEDLE) ×3 IMPLANT
ELECT REM PT RETURN 15FT ADLT (MISCELLANEOUS) ×3 IMPLANT
GAUZE SPONGE 4X4 12PLY STRL (GAUZE/BANDAGES/DRESSINGS) ×3 IMPLANT
GLENOID ANCHOR PEG CROSSLK 44 (Orthopedic Implant) ×2 IMPLANT
GLOVE BIOGEL PI ORTHO PRO 7.5 (GLOVE) ×2
GLOVE BIOGEL PI ORTHO PRO SZ8 (GLOVE) ×2
GLOVE ORTHO TXT STRL SZ7.5 (GLOVE) ×3 IMPLANT
GLOVE PI ORTHO PRO STRL 7.5 (GLOVE) ×1 IMPLANT
GLOVE PI ORTHO PRO STRL SZ8 (GLOVE) ×1 IMPLANT
GLOVE SURG ORTHO 8.5 STRL (GLOVE) ×5 IMPLANT
GOWN STRL REUS W/TWL XL LVL3 (GOWN DISPOSABLE) ×6 IMPLANT
HEAD ECC HUMERAL SZ 48MMX18MM (Head) ×2 IMPLANT
KIT BASIN OR (CUSTOM PROCEDURE TRAY) ×3 IMPLANT
KIT TURNOVER KIT A (KITS) IMPLANT
MANIFOLD NEPTUNE II (INSTRUMENTS) ×3 IMPLANT
NDL MAYO CATGUT SZ4 TPR NDL (NEEDLE) ×1 IMPLANT
NEEDLE MAYO CATGUT SZ4 (NEEDLE) ×3 IMPLANT
PACK SHOULDER (CUSTOM PROCEDURE TRAY) ×3 IMPLANT
PASSER SUT SWANSON 36MM LOOP (INSTRUMENTS) ×2 IMPLANT
PIN METAGLENE 2.5 (PIN) ×4 IMPLANT
RESTRAINT HEAD UNIVERSAL NS (MISCELLANEOUS) ×3 IMPLANT
SLING ARM FOAM STRAP LRG (SOFTGOODS) ×3 IMPLANT
SMARTMIX MINI TOWER (MISCELLANEOUS) ×3
SPONGE SURGIFOAM ABS GEL 12-7 (HEMOSTASIS) ×3 IMPLANT
STEM UNITE SZ12 (Stem) ×2 IMPLANT
STRIP CLOSURE SKIN 1/2X4 (GAUZE/BANDAGES/DRESSINGS) ×2 IMPLANT
SUCTION FRAZIER HANDLE 12FR (TUBING) ×2
SUCTION TUBE FRAZIER 12FR DISP (TUBING) ×1 IMPLANT
SUT FIBERWIRE #2 38 T-5 BLUE (SUTURE) ×12
SUT MNCRL AB 4-0 PS2 18 (SUTURE) ×3 IMPLANT
SUT VIC AB 0 CT1 36 (SUTURE) ×3 IMPLANT
SUT VIC AB 0 CT2 27 (SUTURE) ×3 IMPLANT
SUT VIC AB 2-0 CT1 27 (SUTURE) ×2
SUT VIC AB 2-0 CT1 TAPERPNT 27 (SUTURE) ×1 IMPLANT
SUTURE FIBERWR #2 38 T-5 BLUE (SUTURE) ×4 IMPLANT
TOWEL OR 17X26 10 PK STRL BLUE (TOWEL DISPOSABLE) ×3 IMPLANT
TOWER SMARTMIX MINI (MISCELLANEOUS) ×1 IMPLANT
YANKAUER SUCT BULB TIP 10FT TU (MISCELLANEOUS) ×3 IMPLANT
YANKAUER SUCT BULB TIP NO VENT (SUCTIONS) ×2 IMPLANT

## 2019-01-22 NOTE — Anesthesia Procedure Notes (Signed)
Procedure Name: Intubation Date/Time: 01/22/2019 7:48 AM Performed by: Niel Hummer, CRNA Pre-anesthesia Checklist: Patient identified, Emergency Drugs available, Suction available and Patient being monitored Patient Re-evaluated:Patient Re-evaluated prior to induction Oxygen Delivery Method: Circle system utilized Preoxygenation: Pre-oxygenation with 100% oxygen Induction Type: IV induction Ventilation: Mask ventilation without difficulty Laryngoscope Size: Mac and 4 Grade View: Grade I Tube type: Oral Tube size: 7.5 mm Number of attempts: 1 Airway Equipment and Method: Stylet Placement Confirmation: ETT inserted through vocal cords under direct vision,  positive ETCO2 and breath sounds checked- equal and bilateral Secured at: 22 cm Tube secured with: Tape Dental Injury: Teeth and Oropharynx as per pre-operative assessment

## 2019-01-22 NOTE — Transfer of Care (Signed)
Immediate Anesthesia Transfer of Care Note  Patient: [name]  Procedure(s) Performed: TOTAL SHOULDER ARTHROPLASTY (Right Shoulder)  Patient Location: PACU  Anesthesia Type:General  Level of Consciousness: awake, alert  and oriented  Airway & Oxygen Therapy: Patient Spontanous Breathing and Patient connected to face mask oxygen  Post-op Assessment: Report given to RN and Post -op Vital signs reviewed and stable  Post vital signs: Reviewed and stable  Last Vitals:  Vitals Value Taken Time  BP    Temp    Pulse 101 01/22/19 1012  Resp 21 01/22/19 1012  SpO2 100 % 01/22/19 1012  Vitals shown include unvalidated device data.  Last Pain:  Vitals:   01/22/19 0612  TempSrc: Oral         Complications: No apparent anesthesia complications

## 2019-01-22 NOTE — Anesthesia Procedure Notes (Addendum)
Anesthesia Regional Block: Interscalene brachial plexus block   Pre-Anesthetic Checklist: ,, timeout performed, Correct Patient, Correct Site, Correct Laterality, Correct Procedure, Correct Position, site marked, Risks and benefits discussed,  Surgical consent,  Pre-op evaluation,  At surgeon's request and post-op pain management  Laterality: Right  Prep: chloraprep       Needles:  Injection technique: Single-shot  Needle Type: Stimulator Needle - 40     Needle Length: 4cm  Needle Gauge: 22     Additional Needles:   Procedures:,,,, ultrasound used (permanent image in chart),,,,  Narrative:  Start time: 01/22/2019 7:17 AM End time: 01/22/2019 7:22 AM Injection made incrementally with aspirations every 5 mL.  Performed by: Personally  Anesthesiologist: [name], MD  Additional Notes: BP cuff, EKG monitors applied. Sedation begun. Nerve location verified with U/S. Anesthetic injected incrementally, slowly , and after neg aspirations under direct u/s guidance. Good perineural spread. Tolerated well.

## 2019-01-22 NOTE — Op Note (Signed)
NAME: [name], [name] MEDICAL RECORD [id number] ACCOUNT [id number] DATE OF BIRTH:07-19-1955 FACILITY: [facility] LOCATION: [facility] PHYSICIAN:STEVEN R. Mirella Gueye, MD  OPERATIVE REPORT  DATE OF PROCEDURE:  01/22/2019  PREOPERATIVE DIAGNOSIS:  Right shoulder end-stage arthritis.  POSTOPERATIVE DIAGNOSIS:  Right shoulder end-stage arthritis.  PROCEDURE PERFORMED:  Right anatomic total shoulder replacement using DePuy Global Unite prosthesis with APG glenoid.  ATTENDING SURGEON:  Esmond Plants, MD  ASSISTANT:  Darol Destine, [place], who was scrubbed during the entire procedure and necessary for satisfactory completion of the procedure.  ANESTHESIA:  General anesthesia was used plus interscalene block.  ESTIMATED BLOOD LOSS:  150 mL.  FLUID REPLACEMENT:  1500 mL crystalloid.  INSTRUMENT COUNTS:  Correct.  COMPLICATIONS:  No complications.  ANTIBIOTICS:  Perioperative antibiotics were given.  INDICATIONS:  The patient is a 63 year old male with a history of worsening right shoulder pain secondary to bone-on-bone arthritis.  The patient has failed an extended period of conservative management and presents for operative treatment to relieve  pain and restore function.  Informed consent obtained.  DESCRIPTION OF PROCEDURE:  After an adequate level of adequate level of anesthesia was achieved, the patient was positioned in modified beach chair position.  Right shoulder correctly identified and sterilely prepped and draped in the usual manner.   Time-out called, verifying correct patient, correct site.  We entered the shoulder using a standard deltopectoral approach starting at the coracoid process extending down to the anterior humerus.  Dissection down through subcutaneous tissues using needle  tip Bovie.  Cephalic vein was identified and taken laterally with the deltoid pectoralis taken medially.  Conjoined tendon identified and retracted medially.  Biceps tenodesed in situ with figure-of-eight 0  Vicryl suture incorporating the ____.  We  released the subscapularis off the lesser tuberosity and tagged for repair at the end of surgery.  Inferior capsule released.  The shoulder placed in 30 degrees of external rotation.  We then went ahead and made our anterior, posterior cut using the head  cut guide protecting the rotator cuff with the T-handled Crego elevator and using a large Crego and reverse Hohmann medially.  Once we had our head cut performed, again there was bone-on-bone with eburnated bone noted on the humeral head and deformation  noted.  We removed excess osteophytes with a rongeur with progressive external rotation to get the osteophytes around back.  We then subluxed the humerus posteriorly gaining 360 degree exposure of the glenoid face.  We removed the glenoid labrum and  also some of the hypertrophied capsule.  We had retractors placed visualizing the glenoid, which had extensive wear bone-on-bone.  We removed the remaining cartilage, which was just on the very anterior lip of the glenoid, found our center point for a  guide pin and reamed for the 44 APG glenoid.  Once we had our powered reaming done, we did our peripheral hand reaming and then we drilled our central peg hole.  We then used that as a reference point for our drilling of the superior and 2 inferior peg  holes for the APG glenoid.  Once we had those drilled we placed our trial 44 APG glenoid in place.  It had good bony support and was nice and stable.  We removed that trial component.  We then placed Gelfoam soaked in thrombin in the three peripheral  holes.  Next, we vacuum mixed DePuy high viscosity cement on the back table and cemented the three peripheral holes with a pressurized cement gun and then impacted  the APG glenoid into place.  We held that for 15 minutes until the cement was hardened, we  then went ahead and checked the stability of the glenoid and it was stable and well supported.  We directed our attention  back towards the humeral side.  We reamed from a size 6 reamer up to a size 12.  We briefly stuck the 13 reamer down but it was  just too tight distally so we went ahead and selected the 12 for the distal stem diameter and then did a 12 into the proximal metaphysis.  We broached for the 10 and the 12.  We felt like the 12 was a tiny bit loose.  We selected a 14 metaphysis with the  12 stem and we impacted that in 30 degrees of retroversion.  We then placed our 48, 18 eccentric humeral head component which gave [place] the best coverage of the cut bone surface and dialed that eccentricity superiorly.  We then went ahead and reduced her  shoulder.  We had nice stability.  I could displace the humerus 50% posteriorly and it would automatically relocate and we could pull it inferiorly and it would relocate again proximally into the anatomic alignment.  We removed the trial components from  the humeral side.  We freshened up the lesser tuberosity with a rongeur.  We placed #2 FiberWire suture in the lesser tuberosity for repair of the subscap.  We then used impaction grafting technique with available bone graft and used the press-fit stem,  which was a 12 stem and a 14 proximal metaphysis, placed in 30 degrees of retroversion and impacted into position, had a nice stable implant.  We selected the 48, 18 eccentric and dialed the eccentricity superiorly and posteriorly with good coverage.  We  impacted that on the [facility] taper.  We then reduced the shoulder and then repaired the subscapularis anatomically and the rotator interval with #2 FiberWire suture.  We then ranged the shoulder, had excellent range of motion, nice stability.  We then  irrigated again and closed deltopectoral interval with 0 Vicryl suture followed by 2-0 Vicryl for subcutaneous closure and 4-0 Monocryl for skin.  Steri-Strips applied followed by sterile dressing.  The patient tolerated surgery well.  TN/NUANCE  D:01/22/2019 T:01/22/2019  JOB:[id number]

## 2019-01-22 NOTE — Discharge Instructions (Signed)
Ice to the shoulder constantly.  Keep the incision covered and clean and dry for one week, then ok to get it wet in the shower. ° °Do exercise as instructed several times per day. ° °DO NOT reach behind your back or push up out of a chair with the operative arm. ° °Use a sling while you are up and around for comfort, may remove while seated.  Keep pillow propped behind the operative elbow. ° °Follow up with Dr Fabienne Nolasco in two weeks in the office, call [phone] for appt °

## 2019-01-22 NOTE — Interval H&P Note (Signed)
History and Physical Interval Note:  01/22/2019 10:15 AM  [name]  has presented today for surgery, with the diagnosis of Right shoulder osteoarthritis.  The various methods of treatment have been discussed with the patient and family. After consideration of risks, benefits and other options for treatment, the patient has consented to  Procedure(s) with comments: TOTAL SHOULDER ARTHROPLASTY (Right) - interscalene block as a surgical intervention.  The patient's history has been reviewed, patient examined, no change in status, stable for surgery.  I have reviewed the patient's chart and labs.  Questions were answered to the patient's satisfaction.     Augustin Schooling

## 2019-01-22 NOTE — Anesthesia Postprocedure Evaluation (Signed)
Anesthesia Post Note  Patient: [name]  Procedure(s) Performed: TOTAL SHOULDER ARTHROPLASTY (Right Shoulder)     Patient location during evaluation: PACU Anesthesia Type: General Level of consciousness: sedated and patient cooperative Pain management: pain level controlled Vital Signs Assessment: post-procedure vital signs reviewed and stable Respiratory status: spontaneous breathing Cardiovascular status: stable Anesthetic complications: no    Last Vitals:  Vitals:   01/22/19 1223 01/22/19 1330  BP: 117/90 109/73  Pulse: 88 95  Resp: 18 17  Temp: (!) 36.4 C 36.7 C  SpO2: 99% 97%    Last Pain:  Vitals:   01/22/19 1330  TempSrc: Oral  PainSc:                  [name]

## 2019-01-22 NOTE — Brief Op Note (Signed)
01/22/2019  10:15 AM  PATIENT:  [name]  63 y.o. male  PRE-OPERATIVE DIAGNOSIS:  Right shoulder osteoarthritis, end stage  POST-OPERATIVE DIAGNOSIS:  Right shoulder osteoarthritis, end stage  PROCEDURE:  Procedure(s) with comments: TOTAL SHOULDER ARTHROPLASTY (Right) - interscalene block  DePuy Global Unite, APG Glenoid  SURGEON:  Surgeon(s) and Role:    Netta Cedars, MD - Primary  PHYSICIAN ASSISTANT:   ASSISTANTS: [place] Bruns, PA-C   ANESTHESIA:   regional and general  EBL:  200 mL   BLOOD ADMINISTERED:none  DRAINS: none   LOCAL MEDICATIONS USED:  MARCAINE     SPECIMEN:  No Specimen  DISPOSITION OF SPECIMEN:  N/A  COUNTS:  YES  TOURNIQUET:  * No tourniquets in log *  DICTATION: Other dictation [phone]  PLAN OF CARE: admit  PATIENT DISPOSITION:  PACU - hemodynamically stable.   Delay start of Pharmacological VTE agent (>24hrs) due to surgical blood loss or risk of bleeding: not applicable

## 2019-01-22 NOTE — Anesthesia Preprocedure Evaluation (Signed)
Anesthesia Evaluation  Patient identified by MRN, date of birth, ID band Patient awake    Reviewed: Allergy & Precautions, H&P , NPO status , Patient's Chart, lab work & pertinent test results, reviewed documented beta blocker date and time   Airway Mallampati: II  TM Distance: >3 FB Neck ROM: full    Dental  (+) Dental Advisory Given, Teeth Intact   Pulmonary sleep apnea , pneumonia, resolved, former smoker,    breath sounds clear to auscultation       Cardiovascular hypertension, Pt. on medications  Rhythm:regular     Neuro/Psych  Headaches, PSYCHIATRIC DISORDERS  Neuromuscular disease    GI/Hepatic Neg liver ROS, PUD,   Endo/Other  diabetes, Type 2Morbid obesity  Renal/GU negative Renal ROS     Musculoskeletal   Abdominal (+) + obese,   Peds  Hematology negative hematology ROS (+)   Anesthesia Other Findings See surgeon's H&P   Reproductive/Obstetrics                             Anesthesia Physical  Anesthesia Plan  ASA: III  Anesthesia Plan: General   Post-op Pain Management: GA combined w/ Regional for post-op pain   Induction: Intravenous  PONV Risk Score and Plan: 2 and Ondansetron, Dexamethasone, Treatment may vary due to age or medical condition and Midazolam  Airway Management Planned: Oral ETT  Additional Equipment: None  Intra-op Plan:   Post-operative Plan: Extubation in OR  Informed Consent: I have reviewed the patients History and Physical, chart, labs and discussed the procedure including the risks, benefits and alternatives for the proposed anesthesia with the patient or authorized representative who has indicated his/her understanding and acceptance.     Dental advisory given  Plan Discussed with: CRNA  Anesthesia Plan Comments:         Anesthesia Quick Evaluation

## 2019-01-23 LAB — GLUCOSE, CAPILLARY: Glucose-Capillary: 130 mg/dL — ABNORMAL HIGH (ref 70–99)

## 2019-01-23 LAB — BASIC METABOLIC PANEL
Anion gap: 10 (ref 5–15)
BUN: 19 mg/dL (ref 8–23)
CO2: 25 mmol/L (ref 22–32)
Calcium: 9 mg/dL (ref 8.9–10.3)
Chloride: 101 mmol/L (ref 98–111)
Creatinine, Ser: 0.9 mg/dL (ref 0.61–1.24)
GFR calc Af Amer: >60 mL/min (ref >60)
GFR calc non Af Amer: >60 mL/min (ref >60)
Glucose, Bld: 120 mg/dL — ABNORMAL HIGH (ref 70–99)
Potassium: 4.5 mmol/L (ref 3.5–5.1)
Sodium: 136 mmol/L (ref 135–145)

## 2019-01-23 LAB — HEMOGLOBIN AND HEMATOCRIT, BLOOD
HCT: 38.8 % — ABNORMAL LOW (ref 39.0–52.0)
Hemoglobin: 12.3 g/dL — ABNORMAL LOW (ref 13.0–17.0)

## 2019-01-23 NOTE — Progress Notes (Signed)
Orthopedics Progress Note  Subjective: Patient complains of some pain this morning as the block is wearing off  Objective:  Vitals:   01/23/19 0108 01/23/19 0527  BP: 109/79 113/75  Pulse: 79 69  Resp: 18 18  Temp: 97.9 F (36.6 C) 98.3 F (36.8 C)  SpO2: 99% 99%    General: Awake and alert  Musculoskeletal: right shoulder dressing changed. Wound benign. Moderate swelling Neurovascularly intact  Lab Results  Component Value Date   WBC 9.1 01/14/2019   HGB 12.3 (L) 01/23/2019   HCT 38.8 (L) 01/23/2019   MCV 101.4 (H) 01/14/2019   PLT 219 01/14/2019       Component Value Date/Time   NA 136 01/23/2019 0258   K 4.5 01/23/2019 0258   CL 101 01/23/2019 0258   CO2 25 01/23/2019 0258   GLUCOSE 120 (H) 01/23/2019 0258   BUN 19 01/23/2019 0258   CREATININE 0.90 01/23/2019 0258   CALCIUM 9.0 01/23/2019 0258   GFRNONAA >60 01/23/2019 0258   GFRAA >60 01/23/2019 0258    Lab Results  Component Value Date   INR 1.0 03/11/2008    Assessment/Plan: POD #1 s/p Procedure(s): TOTAL SHOULDER ARTHROPLASTY OT - conservative protocol Discharge to home later this morning Follow up in two weeks in the office  Remo Lipps R. Veverly Fells, MD 01/23/2019 7:59 AM

## 2019-01-23 NOTE — Discharge Summary (Signed)
Orthopedic Discharge Summary        Physician Discharge Summary  Patient ID: [name] MRN: [id number] DOB/AGE: 1956-01-24 63 y.o.  Admit date: 01/22/2019 Discharge date: 01/23/2019   Procedures:  Procedure(s) (LRB): TOTAL SHOULDER ARTHROPLASTY (Right)  Attending Physician:  Dr. Esmond Plants  Admission Diagnoses:   Right shoulder end stage OA  Discharge Diagnoses:  same   Past Medical History:  Diagnosis Date  . Aortic atherosclerosis ([place]) 01/06/2018  . Carpal tunnel syndrome    left hand  . Chest pain, atypical 01/06/2018   Myocardial perfusion scan 10/09/17 EF 67%, no ischemia, reduced exercise tolerance   . Chronic back pain   . Depression    takes Paxil daily  . Diabetes mellitus without complication ([place])    but doesn't take any meds;diet controlled  . Diverticulosis   . Gastric ulcer   . History of bronchitis    last time about 21yrs ago  . History of colon polyps   . History of gout   . History of staph infection 19yrs ago  . Hyperlipidemia    takes Pravastatin daily  . Hypertension    takes Prinzide daily  . Obesity (BMI 30-39.9) 01/06/2018  . Pain in joint of right shoulder 08/06/2017  . Pneumonia    last time about 69yrs ago  . Sleep apnea    sleep study in epic from 2010;doesn't use CPAP    PCP: Leonard Downing, MD   Discharged Condition: good  Hospital Course:  Patient underwent the above stated procedure on 01/22/2019. Patient tolerated the procedure well and brought to the recovery room in good condition and subsequently to the floor. Patient had an uncomplicated hospital course and was stable for discharge.   Disposition: Discharge disposition: 01-Home or Self Care      with follow up in 2 weeks   Follow-up Information    Netta Cedars, MD. Call in 2 weeks.   Specialty: Orthopedic Surgery Why: [phone] Contact information: [address] [place] [address] [phone]           Discharge  Instructions    Call MD / Call 911   Complete by: As directed    If you experience chest pain or shortness of breath, CALL 911 and be transported to the hospital emergency room.  If you develope a fever above 101 F, pus (white drainage) or increased drainage or redness at the wound, or calf pain, call your surgeon's office.   Constipation Prevention   Complete by: As directed    Drink plenty of fluids.  Prune juice may be helpful.  You may use a stool softener, such as Colace (over the counter) 100 mg twice a day.  Use MiraLax (over the counter) for constipation as needed.   Diet - low sodium heart healthy   Complete by: As directed    Driving restrictions   Complete by: As directed    No driving for 2 weeks   Increase activity slowly as tolerated   Complete by: As directed       Allergies as of 01/23/2019      Reactions   Tetracyclines & Related Anaphylaxis      Medication List    TAKE these medications   amphetamine-dextroamphetamine 20 MG tablet Commonly known as: ADDERALL Take 20 mg by mouth daily.   glipiZIDE 10 MG 24 hr tablet Commonly known as: GLUCOTROL XL Take 20 mg by mouth daily with breakfast.   lisinopril-hydrochlorothiazide 20-25 MG tablet  Commonly known as: ZESTORETIC Take 1 tablet by mouth daily.   Melatonin 10 MG Tabs Take 10 mg by mouth at bedtime as needed (sleep).   metFORMIN 1000 MG tablet Commonly known as: GLUCOPHAGE Take 1,000-1,500 mg by mouth See admin instructions. 1000 mg in the morning, 1500 mg at dinner   methocarbamol 500 MG tablet Commonly known as: Robaxin Take 1 tablet (500 mg total) by mouth every 6 (six) hours as needed.   oxyCODONE-acetaminophen 10-325 MG tablet Commonly known as: PERCOCET Take 1 tablet by mouth every 6 (six) hours as needed for pain.   pantoprazole 20 MG tablet Commonly known as: PROTONIX Take 20 mg by mouth daily.   PARoxetine 40 MG tablet Commonly known as: PAXIL Take 40 mg by mouth every morning.    simvastatin 20 MG tablet Commonly known as: ZOCOR Take 20 mg by mouth at bedtime.         Signed: Verlee Rossetti 01/23/2019, 8:02 AM  [facility] is now [employer] [address], [place], [place] [address] Phone: [phone] Facebook  Instagram  [employer]

## 2019-01-23 NOTE — Plan of Care (Signed)
Pt to d/c home today. Pt stable with no needs this am.

## 2019-01-23 NOTE — Progress Notes (Signed)
OT Evaluation  This 63 y/o male presents with the above. PTA pt reports independence with ADL, iADL and functional mobility. Pt performing functional mobility within room at minguard assist level today without AD. He currently requires minA for UB ADL, maxA for sling management, and minguard assist for LB ADL. Pt reports plans to return home with spouse assist with ADL/iADL PRN. Reviewed and educated pt re: shoulder precautions, sling management, HEP, safety and compensatory techniques for completing ADL with pt verbalizing and return demonstrating understanding. Feel pt is safe to return home once medically ready given available family assist. Recommend pt follow up with therapies as recommended per MD. Acute OT to sign off, thank you for this referral.     01/23/19 0800  OT Visit Information  Last OT Received On 01/23/19  Assistance Needed +1  History of Present Illness Pt is a 63 y/o male now s/p R TSA.   Precautions  Precautions Shoulder;Fall  Type of Shoulder Precautions Sling at all times except ADL/exercise, NWB to UE, okay for AROM to e/w/h, No P/AROM to shoulder  Shoulder Interventions Shoulder sling/immobilizer;At all times;Off for dressing/bathing/exercises  Precaution Booklet Issued Yes (comment)  Precaution Comments issued and reviewed with pt   Required Braces or Orthoses Sling  Restrictions  Weight Bearing Restrictions Yes  RUE Weight Bearing NWB  Home Living  Family/patient expects to be discharged to: Private residence  Living Arrangements Spouse/significant other;Other relatives  Available Help at Discharge Family;Available 24 hours/day  Type of Home House  Home Access Stairs to enter  Entrance Stairs-Number of Steps 1  Home Layout One level;Laundry or work area in basement  [employer] Tub/shower unit  [employer] - single point  Prior Function  Level of Independence Independent  Comments driving  Communication   Communication No difficulties  Pain Assessment  Pain Assessment Faces  Faces Pain Scale 4  Pain Location R UE  Pain Descriptors / Indicators Discomfort;Sore  Pain Intervention(s) Limited activity within patient's tolerance;Monitored during session;Repositioned  Cognition  Arousal/Alertness Awake/alert  Behavior During Therapy WFL for tasks assessed/performed  Overall Cognitive Status Within Functional Limits for tasks assessed  Upper Extremity Assessment  Upper Extremity Assessment RUE deficits/detail  RUE Deficits / Details s/p R TSA  RUE Unable to fully assess due to immobilization  RUE Sensation decreased light touch (still feeling nerve block)  RUE Coordination decreased gross motor  Lower Extremity Assessment  Lower Extremity Assessment Overall WFL for tasks assessed  ADL  Overall ADL's  Needs assistance/impaired  Eating/Feeding Set up;Sitting  Grooming Set up;Sitting  Upper Body Bathing Min guard;Sitting  Lower Body Bathing Minimal assistance;Sit to/from stand  Upper Body Dressing  Minimal assistance;Maximal assistance;Sitting  Upper Body Dressing Details (indicate cue type and reason) minA for button up shirt; maxA for sling management  Lower Body Dressing Min guard;Sit to/from stand  Lower Body Dressing Details (indicate cue type and reason) pt with LB clothing donned at start of session  Toilet Transfer Min guard;Ambulation  Toilet Transfer Details (indicate cue type and reason) simulated via transfer to [place] and [facility] Min guard;Sit to/from stand  Functional mobility during ADLs Min guard  Bed Mobility  Overal bed mobility Needs Assistance  Bed Mobility Supine to Sit  Supine to sit Min assist  General bed mobility comments assist for trunk and to ensure pt maintained NWB in RUE  Transfers  Overall transfer level Needs assistance  Equipment used None  Transfers Sit to/from Stand;Stand  Pivot Transfers  Sit to Stand  Supervision  Stand pivot transfers Supervision  General transfer comment for safety and balance  Balance  Overall balance assessment Mild deficits observed, not formally tested  Exercises  Exercises Hand exercises;Shoulder  Shoulder Instructions  Donning/doffing shirt without moving shoulder Minimal assistance;Patient able to independently direct caregiver  Method for sponge bathing under operated UE Min-guard  Donning/doffing sling/immobilizer Maximal assistance;Patient able to independently direct caregiver  Correct positioning of sling/immobilizer Minimal assistance;Patient able to independently direct caregiver  ROM for elbow, wrist and digits of operated UE Min-guard  Sling wearing schedule (on at all times/off for ADL's) Modified independent  Proper positioning of operated UE when showering Min-guard;Patient able to independently direct caregiver  Positioning of UE while sleeping Minimal assistance;Patient able to independently direct caregiver  Shoulder Exercises  Elbow Flexion AAROM;5 reps;Seated;Right (x5 only as pt still feeling nerve block)  Elbow Extension AAROM;Right;5 reps (x5 only as pt still feeling nerve block)  Wrist Flexion AROM;Right;10 reps;Seated  Wrist Extension AROM;Right;10 reps;Seated  Digit Composite Flexion AROM;Right;10 reps;Seated  Composite Extension AROM;Right;10 reps;Seated  Neck Flexion AROM;Seated  Neck Extension AROM;Seated  Neck Lateral Flexion - Right AROM;Seated  Neck Lateral Flexion - Left AROM;Seated  Hand Exercises  Forearm Supination AAROM;5 reps;Right;Seated (x5 only as pt still feeling nerve block)  Forearm Pronation AAROM;Right;5 reps;Seated (x5 only as pt still feeling nerve block)  OT - End of Session  Equipment Utilized During Treatment Other (comment) (sling)  Activity Tolerance Patient tolerated treatment well  Patient left in chair;with call bell/phone within reach  Nurse Communication Mobility status  OT Assessment  OT  Recommendation/Assessment Progress rehab of shoulder as ordered by MD at follow-up appointment  OT Visit Diagnosis Muscle weakness (generalized) (M62.81);Other (comment) (s/p R TSA)  OT Problem List Decreased range of motion;Decreased knowledge of precautions;Impaired UE functional use;Pain;Impaired sensation;Decreased strength;Decreased knowledge of use of DME or AE  AM-PAC OT "6 Clicks" Daily Activity Outcome Measure (Version 2)  Help from another person eating meals? 4  Help from another person taking care of personal grooming? 3  Help from another person toileting, which includes using toliet, bedpan, or urinal? 3  Help from another person bathing (including washing, rinsing, drying)? 3  Help from another person to put on and taking off regular upper body clothing? 2  Help from another person to put on and taking off regular lower body clothing? 3  6 Click Score 18  OT Recommendation  Follow Up Recommendations Follow surgeon's recommendation for DC plan and follow-up therapies;Supervision/Assistance - 24 hour  OT Equipment None recommended by OT  Acute Rehab OT Goals  Patient Stated Goal home today  OT Goal Formulation All assessment and education complete, DC therapy  OT Time Calculation  OT Start Time (ACUTE ONLY) 0852  OT Stop Time (ACUTE ONLY) 0920  OT Time Calculation (min) 28 min  OT General Charges  $OT Visit 1 Visit  OT Evaluation  $OT Eval Low Complexity 1 Low  OT Treatments  $Self Care/Home Management  8-22 mins  Written Expression  Dominant Hand Right    Marcy Siren, OT [facility] Pager [phone] Office [phone]

## 2019-01-25 ENCOUNTER — Encounter (HOSPITAL_COMMUNITY): Payer: Self-pay | Admitting: Orthopedic Surgery

## 2020-05-02 ENCOUNTER — Other Ambulatory Visit: Payer: Self-pay

## 2020-05-02 ENCOUNTER — Encounter: Payer: Self-pay | Admitting: Cardiology

## 2020-05-02 ENCOUNTER — Ambulatory Visit (INDEPENDENT_AMBULATORY_CARE_PROVIDER_SITE_OTHER): Payer: Medicare Other | Admitting: Cardiology

## 2020-05-02 VITALS — BP 110/74 | HR 81 | Ht 68.0 in | Wt 221.6 lb

## 2020-05-02 DIAGNOSIS — I700 Atherosclerosis of aorta: Secondary | ICD-10-CM | POA: Diagnosis not present

## 2020-05-02 DIAGNOSIS — G8929 Other chronic pain: Secondary | ICD-10-CM

## 2020-05-02 DIAGNOSIS — R0789 Other chest pain: Secondary | ICD-10-CM

## 2020-05-02 DIAGNOSIS — E119 Type 2 diabetes mellitus without complications: Secondary | ICD-10-CM | POA: Diagnosis not present

## 2020-05-02 DIAGNOSIS — R0602 Shortness of breath: Secondary | ICD-10-CM | POA: Diagnosis not present

## 2020-05-02 DIAGNOSIS — R1013 Epigastric pain: Secondary | ICD-10-CM

## 2020-05-02 DIAGNOSIS — E782 Mixed hyperlipidemia: Secondary | ICD-10-CM

## 2020-05-02 DIAGNOSIS — R0600 Dyspnea, unspecified: Secondary | ICD-10-CM | POA: Insufficient documentation

## 2020-05-02 DIAGNOSIS — G4733 Obstructive sleep apnea (adult) (pediatric): Secondary | ICD-10-CM

## 2020-05-02 DIAGNOSIS — E669 Obesity, unspecified: Secondary | ICD-10-CM

## 2020-05-02 NOTE — Patient Instructions (Addendum)
Medication Instructions:  No changes *If you need a refill on your cardiac medications before your next appointment, please call your pharmacy*   Lab Work: cmp Lipase Amylase bnp     Testing/Procedures:  [address] Your physician has requested that you have an echocardiogram. Echocardiography is a painless test that uses sound waves to create images of your heart. It provides your doctor with information about the size and shape of your heart and how well your heart's chambers and valves are working. This procedure takes approximately one hour. There are no restrictions for this procedure.  [address] Your physician has requested that you have an abdominal  Ultrasound. During this test, an ultrasound is used to evaluate abdominal. Allow 30 minutes for this exam. Do not eat after midnight the day before and avoid carbonated beverages    Follow-Up: At [facility], you and your health needs are our priority.  As part of our continuing mission to provide you with exceptional heart care, we have created designated Provider Care Teams.  These Care Teams include your primary Cardiologist (physician) and Advanced Practice Providers (APPs -  Physician Assistants and Nurse Practitioners) who all work together to provide you with the care you need, when you need it.  We recommend signing up for the patient portal called "MyChart".  Sign up information is provided on this After Visit Summary.  MyChart is used to connect with patients for Virtual Visits (Telemedicine).  Patients are able to view lab/test results, encounter notes, upcoming appointments, etc.  Non-urgent messages can be sent to your provider as well.   To learn more about what you can do with MyChart, go to [email or web address].    Your next appointment:   1 or 2 month(s)  The format for your next appointment:   In Person  Provider:   Bryan Lemma, MD   Other Instructions

## 2020-05-02 NOTE — Progress Notes (Addendum)
Primary Care Provider: Kaleen Mask, MD Cardiologist: No primary care provider on file.  -> Previously seen by Dr. Donnie Aho Electrophysiologist: None  Clinic Note: Chief Complaint  Patient presents with  . New Patient (Initial Visit)    Chest difficulties, tiredness, weakness   ===================================  ASSESSMENT/PLAN   Problem List Items Addressed This Visit    Diabetes mellitus type 2, noninsulin dependent (HCC) (Chronic)    Apparently started on glipizide by PCP.  A1c being followed-labs not available.      Relevant Orders   EKG 12-Lead (Completed)   ECHOCARDIOGRAM COMPLETE   Pro b natriuretic peptide (BNP)   Comprehensive metabolic panel   Lipase   Amylase   Hyperlipidemia (Chronic)    I do not have recent lipid results letter.  He is on simvastatin.  Labs followed by PCP presumably.  With aortic atherosclerosis, would recommend at least LDL less than 100.      Aortic atherosclerosis (HCC) - Primary (Chronic)    Noted on CT scan.  He clearly has atherosclerotic disease -> Is on statin and ARB-HCTZ for blood pressure control.      Relevant Orders   EKG 12-Lead (Completed)   ECHOCARDIOGRAM COMPLETE   Dyspnea    Dyspnea at rest, bending over and with exertion.  I think this is probably related to abdominal fullness and and GI issues, but will evaluate with an echocardiogram to assess EF.  If there is any regional wall motion abnormalities, that would trigger an ischemic evaluation.  I would like to reassess his symptoms in about 1 to 2 months to see if things are improving before we consider doing an ischemic evaluation.  If we did I would probably consider coronary CT angiogram.      Relevant Orders   EKG 12-Lead (Completed)   ECHOCARDIOGRAM COMPLETE   Pro b natriuretic peptide (BNP)   Sleep apnea    Previously documented, but he is not on CPAP.      Obesity (BMI 30-39.9)    BMI 33.  Certainly weight loss would potentially help his  dyspnea.      Relevant Orders   Comprehensive metabolic panel   Lipase   Amylase   Chest pain, atypical    Previously had a Myoview stress test read as normal back in 2019 for similar sounding symptoms.  In the past, symptoms relieved with PPI.  He has had intermittent issues with H. pylori.  Question if he would potentially benefit from a full GI evaluation.  I recommend right upper quadrant ultrasound as well as amylase and lipase-they were recently checked by PCP, but reasonable to recheck along with CMP.  Not convinced that his chest pain is anginal in nature seems relatively atypical.  If there is any regional wall motion normalities on echo, that would trigger ischemic evaluation.  Also if symptoms continue to persist at follow-up visit, I would consider coronary CT angiogram as this has less likelihood of having vague false positive or negatives. Coronary CTA would also provide anatomical information as far as any underlying CAD that is nonobstructive.      Relevant Orders   EKG 12-Lead (Completed)   Pro b natriuretic peptide (BNP)   Chronic epigastric pain    Again chronic epigastric pain more than chest pain.  I think this is probably non--cardiac in nature.  Sounds more GI related. Plan: Check RUQ Ultrasound along with amylase and lipase => if ischemic evaluation at this time is negative, would recommend GI evaluation  for possible EGD      Relevant Orders   US ABDOMEN LIMITED RUQ (LIVER/GB)   Comprehensive metabolic panel   Lipase   Amylase     ===================================  HPI:    [name] is a 65 y.o. male with a PMH notable for DM-2 (now on medications), HTN, HLD and Obesity who is being seen today for the evaluation of "CHEST DIFFICULTY, FATIGUE, WEAKNESS "at the request of Kaleen Mask, *.  [name] was last seen on on November 04, 2017 by Dr. Donnie Aho.  Initial consult was in July for chest pain described as sharp epigastric discomfort radiating up to  his neck and down his right arm.  Not associated with exertion or eating.  Not relieved with antacids and can last up to 5 minutes to 1 hour.  He had been to the urgent care earlier that month and ruled out for MI.  Symptoms were relieved with GI cocktail. => He was evaluated with a Myoview stress test along with gallbladder ultrasound.  Was also started on PPI. => On follow-up, stress test was nonischemic and gallbladder ultrasound was noncontributory.  He was feeling better on PPI.  Recent Hospitalizations: None  [name] was recently seen by Dr. Valentina Lucks on March 01, 2020 and then in follow-up on April 18, 2020 => December visit was follow-up of H. pylori.  He completed all of his medications and was feeling better.  Still having fullness in his abdomen.  Also occasional lightheadedness.  (Recheck H. pylori was apparently negative. => Being evaluated for left shoulder surgery apparently. -> Follow-up visit -> was noticing some faint dyspnea and weakness with exertion. -> Referral note indicates exertional weak, pain DES via "serious "8-9 months lot" BP noted to be 53/46  Reviewed  CV studies:    The following studies were reviewed today: (if available, images/films reviewed: From Epic Chart or Care Everywhere) . Myoview stress test (10/09/2017): EKG normal, images no evidence of ischemia or infarction EF 67%.  Reduced exercise tolerance.   Interval History:   [name] presents for cardiology evaluation with complaints of what sounds like epigastric discomfort.  He points to his upper abdomen underneath his rib cage he describes a fullness and a pressure there.  He does note maybe some mild exertional dyspnea.  He does not really think the fullness gets worse with exertion, but he does feel some sharp discomfort occasionally with exercise.  He notes feeling tired and fatigued.  He also gets dizzy this oftentimes with bending down and then standing back up again.  Not really associated with  standing.  He says he has had periods of time when his blood pressure is 1 very very low.  He says he is usually pretty active, but has not really been as active lately--last 2 weeks.  He says he gets short of breath if he tries to do too much.  He says he just feels like he has not air when he tries to take a deep breath then.  At that time he can feel his heart beating somewhat strong and irregularly.  Overall, his symptoms are really confusing.  He is not a great historian.  Trying to get an answer questions directly is very difficult.  CV Review of Symptoms (Summary) positive for - dyspnea on exertion, irregular heartbeat, palpitations, shortness of breath and Describes feeling forceful heartbeats, more epigastric than chest pain, positional dizziness with near syncope negative for - chest pain, edema, loss of  consciousness, orthopnea, paroxysmal nocturnal dyspnea, rapid heart rate or TIA or amaurosis fugax, claudication  The patient does not have symptoms concerning for COVID-19 infection (fever, chills, cough, or new shortness of breath).   REVIEWED OF SYSTEMS   Review of Systems  Constitutional: Positive for malaise/fatigue. Negative for weight loss.  HENT: Negative for congestion and nosebleeds.   Respiratory: Positive for shortness of breath. Negative for cough and wheezing.   Gastrointestinal: Positive for abdominal pain (Epigastric) and nausea. Negative for blood in stool, constipation and melena.       He says a lot of the burning that he had when his H. pylori was treated has improved.  Genitourinary: Negative for frequency and hematuria.       Nocturia  Musculoskeletal: Negative for back pain and joint pain.  Neurological: Positive for dizziness (With changing position-bending over to standing up.), weakness (Generalized) and headaches. Negative for seizures.  Psychiatric/Behavioral: Negative for depression and memory loss. The patient has insomnia (Hard time sleeping). The  patient is not nervous/anxious.    I have reviewed and (if needed) personally updated the patient's problem list, medications, allergies, past medical and surgical history, social and family history.   PAST MEDICAL HISTORY   Past Medical History:  Diagnosis Date  . Aortic atherosclerosis (HCC) 01/06/2018   Noted on CT scan  . Carpal tunnel syndrome    left hand  . Chronic back pain   . Depression    takes Paxil daily  . Diabetes mellitus without complication (HCC)    Now on glipizide  . Diverticulosis   . Gastric ulcer   . History of colon polyps   . History of gout   . History of staph infection 21yrs ago  . Hyperlipidemia    takes Pravastatin daily  . Hypertension    takes Prinzide daily  . Obesity (BMI 30-39.9) 01/06/2018  . Pain in joint of right shoulder 08/06/2017  . Pneumonia 2012  . Sleep apnea    sleep study in epic from 2010;doesn't use CPAP    PAST SURGICAL HISTORY   Past Surgical History:  Procedure Laterality Date  . ANTERIOR CERVICAL DECOMP/DISCECTOMY FUSION N/A 11/25/2012   Procedure: ANTERIOR CERVICAL DECOMPRESSION/DISCECTOMY FUSION C4- C6 2 LEVEL;  Surgeon: Venita Lick, MD;  Location: [facility] OR;  Service: Orthopedics;  Laterality: N/A;  . APPENDECTOMY    . BACK SURGERY    . COLONOSCOPY    . ESOPHAGOGASTRODUODENOSCOPY    . left finger surgery    . NM MYOVIEW LTD  10/09/2017   For chest pain:EKG normal, images no evidence of ischemia or infarction EF 67%.  Reduced exercise tolerance.  . right carpal tunnel surgery    . right knee arthroscopy    . right thumb surgery     fused  . SHOULDER SURGERY Right   . skin cancer spot removed a month ago    . TOTAL SHOULDER ARTHROPLASTY Right 01/22/2019   Procedure: TOTAL SHOULDER ARTHROPLASTY;  Surgeon: Beverely Low, MD;  Location: [facility];  Service: Orthopedics;  Laterality: Right;  interscalene block    There is no immunization history on file for this patient.  MEDICATIONS/ALLERGIES   Current Meds   Medication Sig  . amphetamine-dextroamphetamine (ADDERALL) 20 MG tablet Take 20 mg by mouth daily.  Marland Kitchen glipiZIDE (GLUCOTROL XL) 10 MG 24 hr tablet Take 20 mg by mouth daily with breakfast.  . lisinopril-hydrochlorothiazide (PRINZIDE,ZESTORETIC) 20-25 MG tablet Take 1 tablet by mouth daily.  . metFORMIN (GLUCOPHAGE) 1000 MG tablet Take 1,000-1,500  mg by mouth See admin instructions. 1000 mg in the morning, 1500 mg at dinner  . oxyCODONE-acetaminophen (PERCOCET) 10-325 MG tablet Take 1 tablet by mouth every 6 (six) hours as needed for pain.  Marland Kitchen PARoxetine (PAXIL) 40 MG tablet Take 40 mg by mouth every morning.  . simvastatin (ZOCOR) 20 MG tablet Take 20 mg by mouth at bedtime.    Allergies  Allergen Reactions  . Tetracyclines & Related Anaphylaxis    SOCIAL HISTORY/FAMILY HISTORY   Reviewed in Epic:  Pertinent findings:  Social History   Tobacco Use  . Smoking status: Former Smoker    Quit date: 2001    Years since quitting: 21.1  . Smokeless tobacco: Former Neurosurgeon    Quit date: 01/13/2009  . Tobacco comment: quit smoking 64yrs ago  Vaping Use  . Vaping Use: Never used  Substance Use Topics  . Alcohol use: Yes    Comment: once a month  . Drug use: No   Social History   Social History Narrative   Lifestyle:  married;   Residence:  lives with wife, and granddaughter; has 4 children with 12 grandchildren.   Diet:  regular diet; Exercise:  no regular exercise-enjoys doing yard work-just not doing much now because he has not felt like it.  Feeling so tired.   ;  Occupation:  disabled; Previously worked as an Geneticist, molecular.         Family History  Problem Relation Age of Onset  . Heart disease Mother        Pacemaker  . Heart disease Sister        He is not sure the details  . COPD Brother    Brother -- Armed forces training and education officer and well, COPD -> 79 Brother -- Armed forces training and education officer and well -> 58 Brother -- Armed forces training and education officer and well -> 50 Brother -- Armed forces training and education officer and well -> 40 Sister -- Heart disease -> alive at 39 Father  -- dead, Medical history unknown Mother -- dead, Pacemaker in situ  OBJCTIVE -PE, EKG, labs   Wt Readings from Last 3 Encounters:  05/02/20 221 lb 9.6 oz (100.5 kg)  01/14/19 200 lb 3.2 oz (90.8 kg)  10/01/17 219 lb (99.3 kg)    Physical Exam: BP 110/74   Pulse 81   Ht 5\' 8"  (1.727 m)   Wt 221 lb 9.6 oz (100.5 kg)   BMI 33.69 kg/m  Physical Exam Vitals reviewed.  Constitutional:      General: He is not in acute distress.    Appearance: Normal appearance. He is obese. He is not ill-appearing or toxic-appearing.     Comments: Mildly disheveled.  HENT:     Head: Normocephalic and atraumatic.  Neck:     Vascular: No carotid bruit, hepatojugular reflux or JVD.  Cardiovascular:     Rate and Rhythm: Normal rate and regular rhythm.     Pulses: Normal pulses.     Heart sounds: Normal heart sounds. No murmur heard. No gallop.   Pulmonary:     Effort: Pulmonary effort is normal. No respiratory distress.     Breath sounds: Wheezing (Mild late expiratory ) present.     Comments: Mild diffuse interstitial sounds, no rales or rhonchi. Chest:     Chest wall: No tenderness.  Abdominal:     General: There is distension.     Palpations: There is no mass.     Tenderness: There is abdominal tenderness (Epigastric to left upper quadrant distention and tenderness.). There is no guarding or rebound.  Musculoskeletal:        General: No swelling. Normal range of motion.     Cervical back: Normal range of motion and neck supple.  Neurological:     General: No focal deficit present.     Mental Status: He is alert and oriented to person, place, and time.     Cranial Nerves: No cranial nerve deficit.     Motor: No weakness.     Gait: Gait normal.  Psychiatric:        Mood and Affect: Mood normal.        Behavior: Behavior normal.        Thought Content: Thought content normal.        Judgment: Judgment normal.      Adult ECG Report  Rate: 81 ;  Rhythm: normal sinus rhythm and Left  axis deviation.  Cannot exclude septal MI, age undetermined.;   Narrative Interpretation: Borderline  EKG from PCPs office: (Dated 12/24/2018 very difficult to read-sinus rhythm, essentially normal EKG.  Recent Labs: No recent labs available other than H. pylori and amylase/lipase. -> 02/08/2020-H. pylori test was positive amylase/lipase negative. No results found for: CHOL, HDL, LDLCALC, LDLDIRECT, TRIG, CHOLHDL Lab Results  Component Value Date   CREATININE 0.90 01/23/2019   BUN 19 01/23/2019   NA 136 01/23/2019   K 4.5 01/23/2019   CL 101 01/23/2019   CO2 25 01/23/2019   CBC Latest Ref Rng & Units 01/23/2019 01/14/2019 10/01/2017  WBC 4.0 - 10.5 K/uL - 9.1 7.1  Hemoglobin 13.0 - 17.0 g/dL 12.3(L) 14.1 14.5  Hematocrit 39.0 - 52.0 % 38.8(L) 43.1 43.3  Platelets 150 - 400 K/uL - 219 210    No results found for: TSH  ==================================================  COVID-19 Education: The signs and symptoms of COVID-19 were discussed with the patient and how to seek care for testing (follow up with PCP or arrange E-visit).   The importance of social distancing and COVID-19 vaccination was discussed today. The patient is practicing social distancing & Masking.   I spent a total of 31 minutes with the patient spent in direct patient consultation.  Additional time spent with chart review  / charting (studies, outside notes, etc): 30 min Total Time:61 min  Current medicines are reviewed at length with the patient today.  (+/- concerns) N/A  This visit occurred during the SARS-CoV-2 public health emergency.  Safety protocols were in place, including screening questions prior to the visit, additional usage of staff PPE, and extensive cleaning of exam room while observing appropriate contact time as indicated for disinfecting solutions.  Notice: This dictation was prepared with Dragon dictation along with smaller phrase technology. Any transcriptional errors that result from this  process are unintentional and may not be corrected upon review.   Patient Instructions / Medication Changes & Studies & Tests Ordered   Patient Instructions  Medication Instructions:  No changes *If you need a refill on your cardiac medications before your next appointment, please call your pharmacy*   Lab Work: cmp Lipase Amylase bnp     Testing/Procedures:  [address] Your physician has requested that you have an echocardiogram. Echocardiography is a painless test that uses sound waves to create images of your heart. It provides your doctor with information about the size and shape of your heart and how well your heart's chambers and valves are working. This procedure takes approximately one hour. There are no restrictions for this procedure.  [address] Your  physician has requested that you have an abdominal  Ultrasound. During this test, an ultrasound is used to evaluate abdominal. Allow 30 minutes for this exam. Do not eat after midnight the day before and avoid carbonated beverages    Follow-Up: At [facility], you and your health needs are our priority.  As part of our continuing mission to provide you with exceptional heart care, we have created designated Provider Care Teams.  These Care Teams include your primary Cardiologist (physician) and Advanced Practice Providers (APPs -  Physician Assistants and Nurse Practitioners) who all work together to provide you with the care you need, when you need it.  We recommend signing up for the patient portal called "MyChart".  Sign up information is provided on this After Visit Summary.  MyChart is used to connect with patients for Virtual Visits (Telemedicine).  Patients are able to view lab/test results, encounter notes, upcoming appointments, etc.  Non-urgent messages can be sent to your provider as well.   To learn more about what you can do with MyChart, go to [email or web address].     Your next appointment:   1 or 2 month(s)  The format for your next appointment:   In Person  Provider:   Bryan Lemma, MD   Other Instructions     Studies Ordered:   Orders Placed This Encounter  Procedures  . US ABDOMEN LIMITED RUQ (LIVER/GB)  . Pro b natriuretic peptide (BNP)  . Comprehensive metabolic panel  . Lipase  . Amylase  . EKG 12-Lead  . ECHOCARDIOGRAM COMPLETE     Bryan Lemma, M.D., M.S. Interventional Cardiologist   Pager # [phone] Phone # [phone] [address] [place], [place] [address]   Thank you for choosing [facility] at [facility]!!

## 2020-05-04 ENCOUNTER — Encounter: Payer: Self-pay | Admitting: Cardiology

## 2020-05-04 NOTE — Assessment & Plan Note (Signed)
Again chronic epigastric pain more than chest pain.  I think this is probably non--cardiac in nature.  Sounds more GI related. Plan: Check RUQ Ultrasound along with amylase and lipase => if ischemic evaluation at this time is negative, would recommend GI evaluation for possible EGD

## 2020-05-04 NOTE — Assessment & Plan Note (Signed)
Apparently started on glipizide by PCP.  A1c being followed-labs not available.

## 2020-05-04 NOTE — Assessment & Plan Note (Signed)
Previously had a Myoview stress test read as normal back in 2019 for similar sounding symptoms.  In the past, symptoms relieved with PPI.  He has had intermittent issues with H. pylori.  Question if he would potentially benefit from a full GI evaluation.  I recommend right upper quadrant ultrasound as well as amylase and lipase-they were recently checked by PCP, but reasonable to recheck along with CMP.  Not convinced that his chest pain is anginal in nature seems relatively atypical.  If there is any regional wall motion normalities on echo, that would trigger ischemic evaluation.  Also if symptoms continue to persist at follow-up visit, I would consider coronary CT angiogram as this has less likelihood of having vague false positive or negatives. Coronary CTA would also provide anatomical information as far as any underlying CAD that is nonobstructive.

## 2020-05-04 NOTE — Assessment & Plan Note (Signed)
Previously documented, but he is not on CPAP.

## 2020-05-04 NOTE — Assessment & Plan Note (Signed)
BMI 33.  Certainly weight loss would potentially help his dyspnea.

## 2020-05-04 NOTE — Assessment & Plan Note (Signed)
Noted on CT scan.  He clearly has atherosclerotic disease -> Is on statin and ARB-HCTZ for blood pressure control.

## 2020-05-04 NOTE — Assessment & Plan Note (Signed)
I do not have recent lipid results letter.  He is on simvastatin.  Labs followed by PCP presumably.  With aortic atherosclerosis, would recommend at least LDL less than 100.

## 2020-05-04 NOTE — Assessment & Plan Note (Signed)
Dyspnea at rest, bending over and with exertion.  I think this is probably related to abdominal fullness and and GI issues, but will evaluate with an echocardiogram to assess EF.  If there is any regional wall motion abnormalities, that would trigger an ischemic evaluation.  I would like to reassess his symptoms in about 1 to 2 months to see if things are improving before we consider doing an ischemic evaluation.  If we did I would probably consider coronary CT angiogram.

## 2020-05-16 ENCOUNTER — Ambulatory Visit
Admission: RE | Admit: 2020-05-16 | Discharge: 2020-05-16 | Disposition: A | Discharge status: Home / Self Care | Payer: Medicare Other | Source: Ambulatory Visit | Attending: Cardiology | Admitting: Cardiology

## 2020-05-16 DIAGNOSIS — G8929 Other chronic pain: Secondary | ICD-10-CM

## 2020-05-19 ENCOUNTER — Other Ambulatory Visit: Payer: Self-pay

## 2020-05-19 ENCOUNTER — Telehealth: Payer: Self-pay | Admitting: Cardiology

## 2020-05-19 ENCOUNTER — Ambulatory Visit (HOSPITAL_COMMUNITY): Payer: Medicare Other | Attending: Internal Medicine

## 2020-05-19 DIAGNOSIS — E119 Type 2 diabetes mellitus without complications: Secondary | ICD-10-CM | POA: Diagnosis present

## 2020-05-19 DIAGNOSIS — I700 Atherosclerosis of aorta: Secondary | ICD-10-CM | POA: Diagnosis present

## 2020-05-19 DIAGNOSIS — R0602 Shortness of breath: Secondary | ICD-10-CM | POA: Diagnosis present

## 2020-05-19 HISTORY — PX: TRANSTHORACIC ECHOCARDIOGRAM: SHX275

## 2020-05-19 LAB — ECHOCARDIOGRAM COMPLETE
Area-P 1/2: 3.17 cm2
S' Lateral: 2 cm

## 2020-05-19 NOTE — Telephone Encounter (Signed)
Patient states he is returning a call.

## 2020-05-19 NOTE — Telephone Encounter (Signed)
Left message to call back --unsure who called or what in reference to?

## 2020-05-20 LAB — COMPREHENSIVE METABOLIC PANEL
ALT: 58 IU/L — ABNORMAL HIGH (ref 0–44)
AST: 36 IU/L (ref 0–40)
Albumin/Globulin Ratio: 1.6 (ref 1.2–2.2)
Albumin: 4.3 g/dL (ref 3.8–4.8)
Alkaline Phosphatase: 74 IU/L (ref 44–121)
BUN/Creatinine Ratio: 14 (ref 10–24)
BUN: 12 mg/dL (ref 8–27)
Bilirubin Total: 0.7 mg/dL (ref 0.0–1.2)
CO2: 22 mmol/L (ref 20–29)
Calcium: 9.7 mg/dL (ref 8.6–10.2)
Chloride: 99 mmol/L (ref 96–106)
Creatinine, Ser: 0.88 mg/dL (ref 0.76–1.27)
GFR calc Af Amer: 105 mL/min/{1.73_m2} (ref >59)
GFR calc non Af Amer: 91 mL/min/{1.73_m2} (ref >59)
Globulin, Total: 2.7 g/dL (ref 1.5–4.5)
Glucose: 231 mg/dL — ABNORMAL HIGH (ref 65–99)
Potassium: 4.8 mmol/L (ref 3.5–5.2)
Sodium: 139 mmol/L (ref 134–144)
Total Protein: 7 g/dL (ref 6.0–8.5)

## 2020-05-20 LAB — LIPASE: Lipase: 24 U/L (ref 13–78)

## 2020-05-20 LAB — PRO B NATRIURETIC PEPTIDE: NT-Pro BNP: 24 pg/mL (ref 0–210)

## 2020-05-20 LAB — AMYLASE: Amylase: 36 U/L (ref 31–110)

## 2020-05-23 NOTE — Telephone Encounter (Signed)
Spoke with pt, he has already talked to the person that called him. Nothing needed at this time.

## 2020-05-26 ENCOUNTER — Ambulatory Visit (INDEPENDENT_AMBULATORY_CARE_PROVIDER_SITE_OTHER): Payer: Medicare Other

## 2020-05-26 ENCOUNTER — Other Ambulatory Visit: Payer: Self-pay

## 2020-05-26 ENCOUNTER — Ambulatory Visit (INDEPENDENT_AMBULATORY_CARE_PROVIDER_SITE_OTHER): Payer: Medicare Other | Admitting: Cardiology

## 2020-05-26 ENCOUNTER — Encounter: Payer: Self-pay | Admitting: Radiology

## 2020-05-26 ENCOUNTER — Encounter: Payer: Self-pay | Admitting: Cardiology

## 2020-05-26 VITALS — BP 122/84 | HR 90 | Ht 67.0 in | Wt 220.0 lb

## 2020-05-26 DIAGNOSIS — I208 Other forms of angina pectoris: Secondary | ICD-10-CM

## 2020-05-26 DIAGNOSIS — I471 Supraventricular tachycardia, unspecified: Secondary | ICD-10-CM

## 2020-05-26 DIAGNOSIS — E782 Mixed hyperlipidemia: Secondary | ICD-10-CM

## 2020-05-26 DIAGNOSIS — I2089 Other forms of angina pectoris: Secondary | ICD-10-CM

## 2020-05-26 DIAGNOSIS — R1013 Epigastric pain: Secondary | ICD-10-CM

## 2020-05-26 DIAGNOSIS — R0789 Other chest pain: Secondary | ICD-10-CM

## 2020-05-26 DIAGNOSIS — R072 Precordial pain: Secondary | ICD-10-CM

## 2020-05-26 DIAGNOSIS — E119 Type 2 diabetes mellitus without complications: Secondary | ICD-10-CM | POA: Diagnosis not present

## 2020-05-26 DIAGNOSIS — G8929 Other chronic pain: Secondary | ICD-10-CM

## 2020-05-26 NOTE — Progress Notes (Signed)
Enrolled patient for a 14 day Zio XT Monitor to be mailed to patients home  

## 2020-05-26 NOTE — Progress Notes (Signed)
Primary Care Provider: Kaleen Mask, MD Cardiologist: No primary care provider on file.  -> Previously seen by Dr. Donnie [name] Electrophysiologist: None  Clinic Note: Chief Complaint  Patient presents with  . Follow-up    Test results  . Abdominal Pain    Epigastric  . Heart fluttering   ===================================  ASSESSMENT/PLAN   Problem List Items Addressed This Visit    Chest pain, atypical    He was previously evaluated Myoview stress test back in 2019, at that time his symptoms did seem to be more GI in nature.  I want to see what happens may be exercising.  We will do Treadmill (Exercise) Myoview Stress Test.  Pending results, may consider definitive ischemic evaluation with coronary CTA.      Relevant Orders   MYOCARDIAL PERFUSION IMAGING   LONG TERM MONITOR (3-14 DAYS)   Chronic epigastric pain    To date, GI evaluation has suggested the possibility of GERD as well as possible hepatic steatosis, however, the exertional component, exercise intolerance, fatigue and dyspnea are all somewhat concerning.  Plan: Ischemic evaluation with EXERCISE Myoview Stress Test      Paroxysmal SVT (supraventricular tachycardia) (HCC)    Interesting finding on the echocardiogram showing SVT.  Short little bursts of SVT could very well be what he is feeling.  He does describe the fluttering sensation and is somewhat episodic.  Plan: 14-day Zio patch monitor.       Relevant Orders   MYOCARDIAL PERFUSION IMAGING   LONG TERM MONITOR (3-14 DAYS)   Atypical angina (HCC)    His epigastric discomfort now seems to be worse with exertion and associated with heart fluttering.  It could be SVT, but it could also be atypical angina.  GI evaluation seem to be more or less fruitless.  At this point, for second completion, need to complete ischemic evaluation.  Would like to see what happens with exertion as well especially with SVT being a possibility.  As such, a true  stress test over coronary CTA is probably better point.  Plan: EXERCISE MYOVIEWSTRESS TEST  Shared Decision Making/Informed Consent{ All outpatient stress tests require an informed consent ([id number]) ATTESTATION ORDER  The risks [chest pain, shortness of breath, cardiac arrhythmias, dizziness, blood pressure fluctuations, myocardial infarction, stroke/transient ischemic attack, nausea, vomiting, allergic reaction, radiation exposure, metallic taste sensation and life-threatening complications (estimated to be 1 in 10,000)], benefits (risk stratification, diagnosing coronary artery disease, treatment guidance) and alternatives of a nuclear stress test were discussed in detail with [name] and he agrees to proceed.      Relevant Orders   MYOCARDIAL PERFUSION IMAGING   LONG TERM MONITOR (3-14 DAYS)   Diabetes mellitus type 2, noninsulin dependent (HCC) - Primary (Chronic)   Relevant Orders   MYOCARDIAL PERFUSION IMAGING   Hyperlipidemia (Chronic)    Pending results of Myoview stress test, may need to be more aggressive with treatment.  He remains on simvastatin-somewhat concerning with baseline liver pathology.        ===================================  HPI:    [name] is a 65 y.o. male with a PMH notable for DM-2 (now on medications), HTN, HLD and Obesity who is being seen today for the follow-up of "CHEST/EPIGASTRIC DISCOMFORT, FATIGUE, WEAKNESS "after initially being seen for consultation on February 8at the request of [name], *.    His initial cardiac evaluation was in July-August 2018 by Dr. Donnie [name].  He noted sharp and epigastric discomfort radiating to his neck and  down his right arm.  No associated with exertion or eating.  He ruled out for MI during the ER evaluation.  At that time symptoms resolved with GI cocktail. => Was evaluated with Myoview stress test and gallbladder ultrasound and started on PPI.   [name] was referred back for cardiology  consultation-seen on May 02, 2020-evaluation was for.  Epigastric/abdominal fullness along with mild dyspnea and weakness with exertion.  His symptoms seemed relatively atypical, and more GI in nature at that time.  He did mention that the discomfort in his epigastric area felt like a fluttering sensation.  I ordered an echocardiogram and RUQ [place].  Recent Hospitalizations: None  Reviewed  CV studies:    The following studies were reviewed today: (if available, images/films reviewed: From Epic Chart or Care Everywhere) . Echocardiogram 05/19/2020: EF 60 to 65%.  No R WMA.  Normal diastolic parameters.  Normal RV.  Mild aortic sclerosis but no stenosis.  Mild ascending aortic dilation (41 mm).  Otherwise normal. o Interesting finding: Intermittent SVT-rate 130 bpm.  RUQ [place] 05/16/2020: Marked hepatic echogenicity.  Raising question of liver disease associated with hepatic steatosis.  Overall poor acoustic penetration.  No acute biliary pathology.  Normal amylase and lipase.  Normal LFTs except mildly elevated at ALT 58..  High blood sugars.  Borderline  Interval History:   [name] presents for follow-up indicating that she is still having these off-and-on episodes.  He really describes it as a fluttering irregular sensation that can come and go in spurts.  He does say that it may increase with exertion which was something he had not mentioned before.  He does get a little lightheaded with it, and with exertion says he can feel a little bit of sharp discomfort associated with the fluttering.  Also may have some exertional dyspnea. He points to an area in the epigastric area and, just below the rib cage-it is not retrosternal or left-sided chest discomfort..  He still feels generally tired fatigue with intermittent episodes of dizziness.  He will definitely dizzy if he bends down and stands back up again.  He apparently has had episodes where his blood pressures have been low.  Not as active as  he once was.  Gets short of breath if he tries to do too much.  Has difficulty taking deep breath.  He often feels his heart beat beating somewhat irregularly and strong.  As was the case during initial evaluation, he still has some confusing conglomeration of symptoms.  Not a very good historian.   CV Review of Symptoms (Summary) positive for - dyspnea on exertion, irregular heartbeat, palpitations, shortness of breath and Feeling of fluttering sensation, and occasional forceful heartbeats; still describes it the epigastric fullness of the post chest discomfort, but worse with exertion.  Has not had near syncope in the last couple weeks, but still has positional dizziness. negative for - chest pain, edema, loss of consciousness, orthopnea, paroxysmal nocturnal dyspnea, rapid heart rate or TIA or amaurosis fugax, claudication  The patient does not have symptoms concerning for COVID-19 infection (fever, chills, cough, or new shortness of breath).   REVIEWED OF SYSTEMS   Review of Systems  Constitutional: Positive for malaise/fatigue (Has not been as active.  Notes exertional dyspnea). Negative for weight loss.  HENT: Negative for congestion and nosebleeds.   Respiratory: Positive for shortness of breath. Negative for cough and wheezing.   Cardiovascular: Positive for palpitations (Cannot exclude palpitations/irregular forceful beats.).  Gastrointestinal: Positive  for abdominal pain (Epigastric fullness, fluttering) and nausea. Negative for blood in stool, constipation and melena.       He says a lot of the burning that he had when his H. pylori was treated has improved.  Genitourinary: Negative for frequency and hematuria.       Nocturia  Musculoskeletal: Negative for back pain and joint pain.  Neurological: Positive for dizziness (With changing position-bending over to standing up.), weakness (Generalized) and headaches. Negative for seizures.  Psychiatric/Behavioral: Negative for depression  and memory loss. The patient has insomnia (Hard time sleeping). The patient is not nervous/anxious.    No real change from prior evaluation.  I have reviewed and (if needed) personally updated the patient's problem list, medications, allergies, past medical and surgical history, social and family history.   PAST MEDICAL HISTORY   Past Medical History:  Diagnosis Date  . Aortic atherosclerosis (HCC) 01/06/2018   Noted on CT scan  . Carpal tunnel syndrome    left hand  . Chronic back pain   . Depression    takes Paxil daily  . Diabetes mellitus without complication (HCC)    Now on glipizide  . Diverticulosis   . Gastric ulcer   . History of colon polyps   . History of gout   . History of staph infection 26yrs ago  . Hyperlipidemia    takes Pravastatin daily  . Hypertension    takes Prinzide daily  . Obesity (BMI 30-39.9) 01/06/2018  . Pain in joint of right shoulder 08/06/2017  . Pneumonia 2012  . Sleep apnea    sleep study in epic from 2010;doesn't use CPAP    PAST SURGICAL HISTORY   Past Surgical History:  Procedure Laterality Date  . ANTERIOR CERVICAL DECOMP/DISCECTOMY FUSION N/A 11/25/2012   Procedure: ANTERIOR CERVICAL DECOMPRESSION/DISCECTOMY FUSION C4- C6 2 LEVEL;  Surgeon: Venita Lick, MD;  Location: [facility] OR;  Service: Orthopedics;  Laterality: N/A;  . APPENDECTOMY    . BACK SURGERY    . COLONOSCOPY    . ESOPHAGOGASTRODUODENOSCOPY    . left finger surgery    . NM MYOVIEW LTD  10/09/2017   For chest pain:EKG normal, images no evidence of ischemia or infarction EF 67%.  Reduced exercise tolerance.  . right carpal tunnel surgery    . right knee arthroscopy    . right thumb surgery     fused  . SHOULDER SURGERY Right   . skin cancer spot removed a month ago    . TOTAL SHOULDER ARTHROPLASTY Right 01/22/2019   Procedure: TOTAL SHOULDER ARTHROPLASTY;  Surgeon: Beverely Low, MD;  Location: [facility];  Service: Orthopedics;  Laterality: Right;  interscalene block     There is no immunization history on file for this patient.  MEDICATIONS/ALLERGIES   Current Meds  Medication Sig  . amphetamine-dextroamphetamine (ADDERALL) 20 MG tablet Take 20 mg by mouth daily.  Marland Kitchen glipiZIDE (GLUCOTROL XL) 10 MG 24 hr tablet Take 20 mg by mouth daily with breakfast.  . lisinopril-hydrochlorothiazide (PRINZIDE,ZESTORETIC) 20-25 MG tablet Take 1 tablet by mouth daily.  . metFORMIN (GLUCOPHAGE) 1000 MG tablet Take 1,000-1,500 mg by mouth See admin instructions. 1000 mg in the morning, 1500 mg at dinner  . oxyCODONE-acetaminophen (PERCOCET) 10-325 MG tablet Take 1 tablet by mouth every 6 (six) hours as needed for pain.  Marland Kitchen PARoxetine (PAXIL) 40 MG tablet Take 40 mg by mouth every morning.  . simvastatin (ZOCOR) 20 MG tablet Take 20 mg by mouth at bedtime.    Allergies  Allergen Reactions  . Tetracyclines & Related Anaphylaxis    SOCIAL HISTORY/FAMILY HISTORY   Reviewed in Epic:  Pertinent findings:  Social History   Tobacco Use  . Smoking status: Former Smoker    Quit date: 2001    Years since quitting: 21.1  . Smokeless tobacco: Former NeurosurgeonUser    Quit date: 01/13/2009  . Tobacco comment: quit smoking 6270yrs ago  Vaping Use  . Vaping Use: Never used  Substance Use Topics  . Alcohol use: Yes    Comment: once a month  . Drug use: No   Social History   Social History Narrative   Lifestyle:  married;   Residence:  lives with wife, and granddaughter; has 4 children with 12 grandchildren.   Diet:  regular diet; Exercise:  no regular exercise-enjoys doing yard work-just not doing much now because he has not felt like it.  Feeling so tired.   ;  Occupation:  disabled; Previously worked as an Geneticist, molecularHVAC installer.         Family History  Problem Relation Age of Onset  . Heart disease Mother        Pacemaker  . Heart disease Sister        He is not sure the details  . COPD Brother    Brother -- Armed forces training and education officerAlive and well, COPD -> 4161 Brother -- Armed forces training and education officerAlive and well ->  58 Brother -- Armed forces training and education officerAlive and well -> 50 Brother -- Armed forces training and education officerAlive and well -> 40 Sister -- Heart disease -> alive at 3263 Father -- dead, Medical history unknown Mother -- dead, Pacemaker in situ  OBJCTIVE -PE, EKG, labs   Wt Readings from Last 3 Encounters:  05/26/20 220 lb (99.8 kg)  05/02/20 221 lb 9.6 oz (100.5 kg)  01/14/19 200 lb 3.2 oz (90.8 kg)    Physical Exam: BP 122/84   Pulse 90   Ht 5\' 7"  (1.702 m)   Wt 220 lb (99.8 kg)   SpO2 96%   BMI 34.46 kg/m  Physical Exam Vitals reviewed.  Constitutional:      General: He is not in acute distress.    Appearance: Normal appearance. He is obese. He is not ill-appearing or toxic-appearing.     Comments: Mildly disheveled.  HENT:     Head: Normocephalic and atraumatic.  Neck:     Vascular: No carotid bruit, hepatojugular reflux or JVD.  Cardiovascular:     Rate and Rhythm: Normal rate and regular rhythm.     Pulses: Normal pulses.     Heart sounds: Normal heart sounds. No murmur heard. No gallop.   Pulmonary:     Effort: Pulmonary effort is normal. No respiratory distress.     Breath sounds: Wheezing (Mild late expiratory ) present.     Comments: Mild diffuse interstitial sounds, no rales or rhonchi. Chest:     Chest wall: No tenderness.  Abdominal:     General: Bowel sounds are normal. There is distension.     Palpations: There is no mass.     Tenderness: There is abdominal tenderness (Epigastric to left upper quadrant distention and tenderness.). There is no guarding or rebound.  Musculoskeletal:        General: No swelling. Normal range of motion.     Cervical back: Normal range of motion and neck supple.  Skin:    General: Skin is warm and dry.  Neurological:     General: No focal deficit present.     Mental Status: He is alert and oriented to  person, place, and time.     Cranial Nerves: No cranial nerve deficit.  Psychiatric:        Mood and Affect: Mood normal.        Behavior: Behavior normal.        Thought Content:  Thought content normal.        Judgment: Judgment normal.      Adult ECG Report N/A  Recent Labs:  Lab Results  Component Value Date   AMYLASE 36 05/19/2020   Lab Results  Component Value Date   LIPASE 24 05/19/2020   Lab Results  Component Value Date   ALT 58 (H) 05/19/2020   AST 36 05/19/2020   ALKPHOS 74 05/19/2020   BILITOT 0.7 05/19/2020   No results found for: CHOL, HDL, LDLCALC, LDLDIRECT, TRIG, CHOLHDL Lab Results  Component Value Date   CREATININE 0.88 05/19/2020   BUN 12 05/19/2020   NA 139 05/19/2020   K 4.8 05/19/2020   CL 99 05/19/2020   CO2 22 05/19/2020   CBC Latest Ref Rng & Units 01/23/2019 01/14/2019 10/01/2017  WBC 4.0 - 10.5 K/uL - 9.1 7.1  Hemoglobin 13.0 - 17.0 g/dL 12.3(L) 14.1 14.5  Hematocrit 39.0 - 52.0 % 38.8(L) 43.1 43.3  Platelets 150 - 400 K/uL - 219 210    No results found for: TSH  ==================================================  COVID-19 Education: The signs and symptoms of COVID-19 were discussed with the patient and how to seek care for testing (follow up with PCP or arrange E-visit).   The importance of social distancing and COVID-19 vaccination was discussed today. The patient is practicing social distancing & Masking.   I spent a total of 24 minutes with the patient spent in direct patient consultation.  Additional time spent with chart review  / charting (studies, outside notes, etc): Total Time:69min  Current medicines are reviewed at length with the patient today.  (+/- concerns) N/A  This visit occurred during the SARS-CoV-2 public health emergency.  Safety protocols were in place, including screening questions prior to the visit, additional usage of staff PPE, and extensive cleaning of exam room while observing appropriate contact time as indicated for disinfecting solutions.  Notice: This dictation was prepared with Dragon dictation along with smaller phrase technology. Any transcriptional errors that result  from this process are unintentional and may not be corrected upon review.   Patient Instructions / Medication Changes & Studies & Tests Ordered   Patient Instructions  Medication Instructions:  NO CHANGES *If you need a refill on your cardiac medications before your next appointment, please call your pharmacy*   Lab Work: NOT NEEDED   Testing/Procedures: Monitor will be mailed to you - Your physician has recommended that you wear a 14 day ZIO holter monitor. Holter monitors are medical devices that record the heart's electrical activity. Doctors most often use these monitors to diagnose arrhythmias. Arrhythmias are problems with the speed or rhythm of the heartbeat. The monitor is a small, portable device. You can wear one while you do your normal daily activities. This is usually used to diagnose what is causing palpitations/syncope (passing out).  WILL BE SCHEDULE AT [address] Your physician has requested that you have en exercise stress myoview.  Please follow instruction sheet, as given.    Follow-Up: At [facility], you and your health needs are our priority.  As part of our continuing mission to provide you with exceptional heart care, we have created designated Provider Care  Teams.  These Care Teams include your primary Cardiologist (physician) and Advanced Practice Providers (APPs -  Physician Assistants and Nurse Practitioners) who all work together to provide you with the care you need, when you need it.  We recommend signing up for the patient portal called "MyChart".  Sign up information is provided on this After Visit Summary.  MyChart is used to connect with patients for Virtual Visits (Telemedicine).  Patients are able to view lab/test results, encounter notes, upcoming appointments, etc.  Non-urgent messages can be sent to your provider as well.   To learn more about what you can do with MyChart, go to [email or web address].    Your next  appointment:   2 month(s)  The format for your next appointment:   In Person  Provider:   Bryan Lemma, MD   Other Instructions  ZIO XT- Long Term Monitor Instructions   Your physician has requested you wear your ZIO patch monitor___14____days.  Instructions provided in AVS.   Studies Ordered:   Orders Placed This Encounter  Procedures  . MYOCARDIAL PERFUSION IMAGING  . LONG TERM MONITOR (3-14 DAYS)     Bryan Lemma, M.D., M.S. Interventional Cardiologist   Pager # [phone] Phone # [phone] [address] [place], [place] [address]   Thank you for choosing Heartcare at [facility]!!

## 2020-05-26 NOTE — Patient Instructions (Signed)
Medication Instructions:  NO CHANGES *If you need a refill on your cardiac medications before your next appointment, please call your pharmacy*   Lab Work: NOT NEEDED   Testing/Procedures: Monitor will be mailed to you - Your physician has recommended that you wear a 14 day ZIO holter monitor. Holter monitors are medical devices that record the heart's electrical activity. Doctors most often use these monitors to diagnose arrhythmias. Arrhythmias are problems with the speed or rhythm of the heartbeat. The monitor is a small, portable device. You can wear one while you do your normal daily activities. This is usually used to diagnose what is causing palpitations/syncope (passing out).  WILL BE SCHEDULE AT [address] Your physician has requested that you have en exercise stress myoview.  Please follow instruction sheet, as given.    Follow-Up: At [facility], you and your health needs are our priority.  As part of our continuing mission to provide you with exceptional heart care, we have created designated Provider Care Teams.  These Care Teams include your primary Cardiologist (physician) and Advanced Practice Providers (APPs -  Physician Assistants and Nurse Practitioners) who all work together to provide you with the care you need, when you need it.  We recommend signing up for the patient portal called "MyChart".  Sign up information is provided on this After Visit Summary.  MyChart is used to connect with patients for Virtual Visits (Telemedicine).  Patients are able to view lab/test results, encounter notes, upcoming appointments, etc.  Non-urgent messages can be sent to your provider as well.   To learn more about what you can do with MyChart, go to [email or web address].    Your next appointment:   2 month(s)  The format for your next appointment:   In Person  Provider:   Bryan Lemma, MD   Other Instructions  ZIO XT- Long Term Monitor  Instructions   Your physician has requested you wear your ZIO patch monitor___14____days.   This is a single patch monitor.  [employer] supplies one patch monitor per enrollment.  Additional stickers are not available.   Please do not apply patch if you will be having a Nuclear Stress Test, Echocardiogram, Cardiac CT, MRI, or Chest Xray during the time frame you would be wearing the monitor. The patch cannot be worn during these tests.  You cannot remove and re-apply the ZIO XT patch monitor.   Your ZIO patch monitor will be sent USPS Priority mail from [facility] directly to your home address. The monitor may also be mailed to a PO BOX if home delivery is not available.   It may take 3-5 days to receive your monitor after you have been enrolled.   Once you have received you monitor, please review enclosed instructions.  Your monitor has already been registered assigning a specific monitor serial # to you.   Applying the monitor   Shave hair from upper left chest.   Hold abrader disc by orange tab.  Rub abrader in 40 strokes over left upper chest as indicated in your monitor instructions.   Clean area with 4 enclosed alcohol pads .  Use all pads to assure are is cleaned thoroughly.  Let dry.   Apply patch as indicated in monitor instructions.  Patch will be place under collarbone on left side of chest with arrow pointing upward.   Rub patch adhesive wings for 2 minutes.Remove white label marked "1".  Remove white label marked "2".  Rub patch  adhesive wings for 2 additional minutes.   While looking in a mirror, press and release button in center of patch.  A small green light will flash 3-4 times .  This will be your only indicator the monitor has been turned on.     Do not shower for the first 24 hours.  You may shower after the first 24 hours.   Press button if you feel a symptom. You will hear a small click.  Record Date, Time and Symptom in the Patient Log Book.   When you are  ready to remove patch, follow instructions on last 2 pages of Patient Log Book.  Stick patch monitor onto last page of Patient Log Book.   Place Patient Log Book in [place] box.  Use locking tab on box and tape box closed securely.  The [employer] and [employer] has [employer] on it.  Please place in mailbox as soon as possible.  Your physician should have your test results approximately 7 days after the monitor has been mailed back to [facility].   Call [employer] Customer Care at [phone] if you have questions regarding your ZIO XT patch monitor.  Call them immediately if you see an orange light blinking on your monitor.   If your monitor falls off in less than 4 days contact our Monitor department at [phone].  If your monitor becomes loose or falls off after 4 days call [employer] at [phone] for suggestions on securing your monitor.

## 2020-05-28 ENCOUNTER — Encounter: Payer: Self-pay | Admitting: Cardiology

## 2020-05-28 NOTE — Assessment & Plan Note (Signed)
Interesting finding on the echocardiogram showing SVT.  Short little bursts of SVT could very well be what he is feeling.  He does describe the fluttering sensation and is somewhat episodic.  Plan: 14-day Zio patch monitor.

## 2020-05-28 NOTE — Assessment & Plan Note (Signed)
Pending results of Myoview stress test, may need to be more aggressive with treatment.  He remains on simvastatin-somewhat concerning with baseline liver pathology.

## 2020-05-28 NOTE — Assessment & Plan Note (Signed)
He was previously evaluated Myoview stress test back in 2019, at that time his symptoms did seem to be more GI in nature.  I want to see what happens may be exercising.  We will do Treadmill (Exercise) Myoview Stress Test.  Pending results, may consider definitive ischemic evaluation with coronary CTA.

## 2020-05-28 NOTE — Assessment & Plan Note (Signed)
To date, GI evaluation has suggested the possibility of GERD as well as possible hepatic steatosis, however, the exertional component, exercise intolerance, fatigue and dyspnea are all somewhat concerning.  Plan: Ischemic evaluation with EXERCISE Myoview Stress Test

## 2020-05-28 NOTE — Assessment & Plan Note (Signed)
His epigastric discomfort now seems to be worse with exertion and associated with heart fluttering.  It could be SVT, but it could also be atypical angina.  GI evaluation seem to be more or less fruitless.  At this point, for second completion, need to complete ischemic evaluation.  Would like to see what happens with exertion as well especially with SVT being a possibility.  As such, a true stress test over coronary CTA is probably better point.  Plan: EXERCISE MYOVIEWSTRESS TEST  Shared Decision Making/Informed Consent{ All outpatient stress tests require an informed consent ([id number]) ATTESTATION ORDER  The risks [chest pain, shortness of breath, cardiac arrhythmias, dizziness, blood pressure fluctuations, myocardial infarction, stroke/transient ischemic attack, nausea, vomiting, allergic reaction, radiation exposure, metallic taste sensation and life-threatening complications (estimated to be 1 in 10,000)], benefits (risk stratification, diagnosing coronary artery disease, treatment guidance) and alternatives of a nuclear stress test were discussed in detail with [name] and he agrees to proceed.

## 2020-05-30 ENCOUNTER — Telehealth: Payer: Self-pay | Admitting: *Deleted

## 2020-05-30 DIAGNOSIS — R0789 Other chest pain: Secondary | ICD-10-CM

## 2020-05-30 DIAGNOSIS — R0609 Other forms of dyspnea: Secondary | ICD-10-CM

## 2020-05-30 DIAGNOSIS — R0600 Dyspnea, unspecified: Secondary | ICD-10-CM

## 2020-05-30 NOTE — Telephone Encounter (Signed)
Please sign  Attestation for  Exercise stress myoview . It will be schedule once completed

## 2020-05-30 NOTE — Telephone Encounter (Signed)
-----   Message from Minette Brine sent at 05/29/2020 11:19 AM EST ----- Regarding: ATTESTATION Patient needs ATTESTATION created and signed by the MD please in order for me to schedule and pt to have test. Thank you.

## 2020-06-08 ENCOUNTER — Telehealth (HOSPITAL_COMMUNITY): Payer: Self-pay | Admitting: *Deleted

## 2020-06-08 NOTE — Telephone Encounter (Signed)
Patient given detailed instructions per Myocardial Perfusion Study Information Sheet for the test on 06/12/20 at 8:15. Patient notified to arrive 15 minutes early and that it is imperative to arrive on time for appointment to keep from having the test rescheduled.  If you need to cancel or reschedule your appointment, please call the office within 24 hours of your appointment. . Patient verbalized understanding.[name]

## 2020-06-09 ENCOUNTER — Other Ambulatory Visit (HOSPITAL_COMMUNITY)
Admission: RE | Admit: 2020-06-09 | Discharge: 2020-06-09 | Disposition: A | Discharge status: Home / Self Care | Payer: Medicare Other | Source: Ambulatory Visit | Attending: Cardiology | Admitting: Cardiology

## 2020-06-09 DIAGNOSIS — Z20822 Contact with and (suspected) exposure to covid-19: Secondary | ICD-10-CM | POA: Diagnosis not present

## 2020-06-09 DIAGNOSIS — Z01812 Encounter for preprocedural laboratory examination: Secondary | ICD-10-CM | POA: Insufficient documentation

## 2020-06-10 LAB — SARS CORONAVIRUS 2 (TAT 6-24 HRS): SARS Coronavirus 2: NEGATIVE

## 2020-06-12 ENCOUNTER — Ambulatory Visit (HOSPITAL_COMMUNITY): Payer: Medicare Other | Attending: Cardiology

## 2020-06-12 ENCOUNTER — Other Ambulatory Visit: Payer: Self-pay

## 2020-06-12 DIAGNOSIS — E119 Type 2 diabetes mellitus without complications: Secondary | ICD-10-CM | POA: Insufficient documentation

## 2020-06-12 DIAGNOSIS — I471 Supraventricular tachycardia: Secondary | ICD-10-CM | POA: Diagnosis not present

## 2020-06-12 DIAGNOSIS — I208 Other forms of angina pectoris: Secondary | ICD-10-CM | POA: Diagnosis not present

## 2020-06-12 DIAGNOSIS — R0789 Other chest pain: Secondary | ICD-10-CM

## 2020-06-12 DIAGNOSIS — R072 Precordial pain: Secondary | ICD-10-CM

## 2020-06-12 HISTORY — PX: NM MYOVIEW LTD: HXRAD82

## 2020-06-12 LAB — MYOCARDIAL PERFUSION IMAGING
Estimated workload: 7 METS
Exercise duration (min): 6 min
Exercise duration (sec): 0 s
LV dias vol: 69 mL (ref 62–150)
LV sys vol: 28 mL
MPHR: 156 {beats}/min
Peak HR: 139 {beats}/min
Percent HR: 89 %
Rest HR: 71 {beats}/min
SDS: 0
SRS: 2
SSS: 2
TID: 0.87

## 2020-06-12 MED ORDER — TECHNETIUM TC 99M TETROFOSMIN IV KIT
32.4000 | PACK | Freq: Once | INTRAVENOUS | Status: AC | PRN
  Administered 2020-06-12: 32.4 via INTRAVENOUS
  Filled 2020-06-12: qty 33

## 2020-06-12 MED ORDER — TECHNETIUM TC 99M TETROFOSMIN IV KIT
10.9000 | PACK | Freq: Once | INTRAVENOUS | Status: AC | PRN
  Administered 2020-06-12: 10.9 via INTRAVENOUS
  Filled 2020-06-12: qty 11

## 2020-06-19 ENCOUNTER — Telehealth: Payer: Self-pay | Admitting: Cardiology

## 2020-06-19 NOTE — Telephone Encounter (Signed)
Pt advised his Echo results and copy sent to his PCP.

## 2020-06-19 NOTE — Telephone Encounter (Signed)
[name] is returning [name]'s call in regards to his exercise tolerance test results. Please advise.

## 2020-07-04 HISTORY — PX: OTHER SURGICAL HISTORY: SHX169

## 2020-07-07 ENCOUNTER — Telehealth: Payer: Self-pay | Admitting: *Deleted

## 2020-07-07 DIAGNOSIS — I471 Supraventricular tachycardia: Secondary | ICD-10-CM

## 2020-07-07 NOTE — Telephone Encounter (Signed)
The patient has been notified of the result and verbalized understanding.  All questions (if any) were answered. Patient aware of referral to Ep . Patient states he is having the episode more often . RN informed patient will indicate in referral  . Keep appointment with Dr Herbie Baltimore on 07/27/20  Tobin Chad, RN 07/07/2020 5:52 PM

## 2020-07-07 NOTE — Telephone Encounter (Signed)
-----   Message from Marykay Lex, MD sent at 07/06/2020  7:56 PM EDT ----- Narrative & Impression   Predominant rhythm was sinus rhythm: Minimal heart rate 43 bpm, maximum 139 bpm,average 79 bpm.  122 Supraventricular Tachycardia runs occurred, the run with the fastest interval lasting 59.9 9 secs with a max rate of 174 bpm, the longest lasting 1 hour 36 mins with an avg rate of 124 bpm.  1 Pause occurred lasting 5.4 secs (12 bpm). Pause(s) occurred due to Possible High Grade AV Block. -> Prolonged PR interval none asystole for 3 block P waves. (This was at 4:30 in the AM)  Symptoms are definitely noted with SVT. Not a pause.  Other than the SVT episodes and pause, there were rare PACs -> mostly isolated, but also some couplets and triplets.  Very rare (only ~50) PVCs noted. No couplets 1 triplet    Patch Wear Time:  12 days and 18 hours (2022-03-21T19:54:58-0400 to 2022-04-03T14:12:05-398)  Patient has an abnormal study.  Most notable on the supraventricular tachycardia episodes.  This could explain some of the symptoms.  One concerning feature about how to treat these episodes is that there was a prolonged pause while sleeping 1 night.  My plan is to Refer you to one of my Electrophysiology partners (heart electricians) to determine what the next appropriate step should be.  Nothing dramatically life-threatening, but treatment options may be somewhat limited by the pause.   Bryan Lemma, MD   Jasmine December - can we refer him to EP - Dr. Elberta Fortis, Lalla Brothers or Ladona Ridgel.  Dx PSVT.

## 2020-07-27 ENCOUNTER — Other Ambulatory Visit: Payer: Self-pay

## 2020-07-27 ENCOUNTER — Encounter: Payer: Self-pay | Admitting: Cardiology

## 2020-07-27 ENCOUNTER — Ambulatory Visit (INDEPENDENT_AMBULATORY_CARE_PROVIDER_SITE_OTHER): Payer: Medicare Other | Admitting: Cardiology

## 2020-07-27 VITALS — BP 104/72 | HR 80 | Ht 69.0 in | Wt 215.2 lb

## 2020-07-27 DIAGNOSIS — R1013 Epigastric pain: Secondary | ICD-10-CM

## 2020-07-27 DIAGNOSIS — I208 Other forms of angina pectoris: Secondary | ICD-10-CM

## 2020-07-27 DIAGNOSIS — I471 Supraventricular tachycardia: Secondary | ICD-10-CM

## 2020-07-27 DIAGNOSIS — E782 Mixed hyperlipidemia: Secondary | ICD-10-CM | POA: Diagnosis not present

## 2020-07-27 DIAGNOSIS — G8929 Other chronic pain: Secondary | ICD-10-CM | POA: Diagnosis not present

## 2020-07-27 NOTE — Patient Instructions (Signed)
Medication Instructions:    stop taking Adderall  *If you need a refill on your cardiac medications before your next appointment, please call your pharmacy*   Lab Work: No changes   Testing/Procedures: notneeded   Follow-Up: At [facility], you and your health needs are our priority.  As part of our continuing mission to provide you with exceptional heart care, we have created designated Provider Care Teams.  These Care Teams include your primary Cardiologist (physician) and Advanced Practice Providers (APPs -  Physician Assistants and Nurse Practitioners) who all work together to provide you with the care you need, when you need it.  We recommend signing up for the patient portal called "MyChart".  Sign up information is provided on this After Visit Summary.  MyChart is used to connect with patients for Virtual Visits (Telemedicine).  Patients are able to view lab/test results, encounter notes, upcoming appointments, etc.  Non-urgent messages can be sent to your provider as well.   To learn more about what you can do with MyChart, go to [email or web address].    Your next appointment:   4 month(s)  The format for your next appointment:   In Person  Provider:   Bryan Lemma, MD  Keep appointment with Dr Ladona Ridgel for tomorrow

## 2020-07-27 NOTE — Progress Notes (Incomplete)
Primary Care Provider: Kaleen MaskElkins, Wilson Oliver, MD Cardiologist: None Electrophysiologist: None  Clinic Note: No chief complaint on file.   ===================================  ASSESSMENT/PLAN   Problem List Items Addressed This Visit   None     ===================================  HPI:    [name] is a 65 y.o. male with a PMH notable for DM-2, HTN, HLD and obesity who presents today for 1973-month follow-up for episodic CHEST/EPIGASTRIC DISCOMFORT, FATIGUE AND WEAKNESS AND RAPID PALPITATIONS -> Myoview and monitor results.  [name] was last seen on 05/26/2020 following echocardiogram evaluation.  He was still having significant symptoms of a sharp pulsating discomfort in his epigastric area (chest.  Not associate with eating or exertion, can happen at all times during the day.  He then described a sensation of fluttering in his chest.  He will get short of breath with the spells and noted there is some irregular heartbeat episodes.  He could not truly tell if the symptoms are made worse with exertion or not, but felt like the fluttering became faster.  Spells can last anywhere from a few minutes to an hour.  Usually the episodes are followed by generalized fatigue. -> Thinking that the symptoms could potentially be anginal, or potentially arrhythmogenic such as SVT.  We ordered a Myoview stress test and 14-day Zio patch monitor.  Recent Hospitalizations: None  Reviewed  CV studies:    The following studies were reviewed today: (if available, images/films reviewed: From Epic Chart or Care Everywhere)  . Myoview 06/12/2020:: Normal EF 60%.  No ischemia or infarction.  Normal study. . Echo 05/19/2020: Heart rate was 130 bpm.  LVEF 60 to 65%.  Mild aortic valve calcification with no stenosis.  Normal atrial sizes.  Mildly elevated PAP.  Noted to be in SVT during part of the study . Monitor 07/04/2020: 13-day  Predominant rhythm was sinus rhythm: Minimal heart rate 43 bpm, maximum  139 bpm,average 79 bpm.  122 Supraventricular Tachycardia runs occurred, the run with the fastest interval lasting 59.9 9 secs with a max rate of 174 bpm, the longest lasting 1 hour 36 mins with an avg rate of 124 bpm.  1 Pause occurred lasting 5.4 secs (12 bpm). Pause(s) occurred due to Possible High Grade AV Block. -> Prolonged PR interval none asystole for 3 block P waves. (This was at 4:30 in the AM)  Symptoms are definitely noted with SVT. Not a pause.  Other than the SVT episodes and pause, there were rare PACs -> mostly isolated, but also some couplets and triplets.  Very rare (only ~50) PVCs noted. No couplets 1 triplet  Interval History:   [name] HanksLoy J [name] returns today noting that he is having a lot of his "episodes ".  In the last month it seems to have picked up.  When I discussed what SVT is, he says he definitely felt the symptoms.  Not all the ones that were seen on the monitor, clearly but probably the ones that lasted least 5 to 10 minutes.  He certainly remembers the episode that lasted an hour and a half. He definitely reiterates a sensation of of the irregular fast heartbeat spells associated with shortness of breath and a sharp pain in his epigastrium.  No real nausea or vomiting.  No relief with treating for GERD.  CV Review of Symptoms (Summary): positive for - chest pain, dyspnea on exertion, irregular heartbeat, rapid heart rate, shortness of breath and *** negative for - {:310662:o}  The patient {does/does  not:200015} have symptoms concerning for COVID-19 infection (fever, chills, cough, or new shortness of breath).   REVIEWED OF SYSTEMS   ROS for malaise/fatigue (Has not been as active.  Notes exertional dyspnea). Negative for weight loss.  HENT: Negative for congestion and nosebleeds.   Respiratory: Positive for shortness of breath. Negative for cough and wheezing.   Cardiovascular: Positive for palpitations (Cannot exclude palpitations/irregular forceful beats.).   Gastrointestinal: Positive for abdominal pain (Epigastric fullness, fluttering) and nausea. Negative for blood in stool, constipation and melena.       He says a lot of the burning that he had when his H. pylori was treated has improved.  Genitourinary: Negative for frequency and hematuria.       Nocturia  Musculoskeletal: Negative for back pain and joint pain.  Neurological: Positive for dizziness (With changing position-bending over to standing up.), weakness (Generalized) and headaches. Negative for seizures.  Psychiatric/Behavioral: Negative for depression and memory loss. The patient has insomnia (Hard time sleeping). The patient is not nervous/anxious.     I have reviewed and (if needed) personally updated the patient's problem list, medications, allergies, past medical and surgical history, social and family history.   PAST MEDICAL HISTORY   Past Medical History:  Diagnosis Date  . Aortic atherosclerosis (HCC) 01/06/2018   Noted on CT scan  . Carpal tunnel syndrome    left hand  . Chronic back pain   . Depression    takes Paxil daily  . Diabetes mellitus without complication (HCC)    Now on glipizide  . Diverticulosis   . Gastric ulcer   . History of colon polyps   . History of gout   . History of staph infection 64yrs ago  . Hyperlipidemia    takes Pravastatin daily  . Hypertension    takes Prinzide daily  . Obesity (BMI 30-39.9) 01/06/2018  . Pain in joint of right shoulder 08/06/2017  . Pneumonia 2012  . Sleep apnea    sleep study in epic from 2010;doesn't use CPAP    PAST SURGICAL HISTORY   Past Surgical History:  Procedure Laterality Date  . ANTERIOR CERVICAL DECOMP/DISCECTOMY FUSION N/A 11/25/2012   Procedure: ANTERIOR CERVICAL DECOMPRESSION/DISCECTOMY FUSION C4- C6 2 LEVEL;  Surgeon: Venita Lick, MD;  Location: [facility] OR;  Service: Orthopedics;  Laterality: N/A;  . APPENDECTOMY    . BACK SURGERY    . COLONOSCOPY    . ESOPHAGOGASTRODUODENOSCOPY    . left  finger surgery    . NM MYOVIEW LTD  10/09/2017   For chest pain:EKG normal, images no evidence of ischemia or infarction EF 67%.  Reduced exercise tolerance.  . right carpal tunnel surgery    . right knee arthroscopy    . right thumb surgery     fused  . SHOULDER SURGERY Right   . skin cancer spot removed a month ago    . TOTAL SHOULDER ARTHROPLASTY Right 01/22/2019   Procedure: TOTAL SHOULDER ARTHROPLASTY;  Surgeon: Beverely Low, MD;  Location: [facility] ORS;  Service: Orthopedics;  Laterality: Right;  interscalene block     There is no immunization history on file for this patient.  MEDICATIONS/ALLERGIES   Current Meds  Medication Sig  . amphetamine-dextroamphetamine (ADDERALL) 20 MG tablet Take 20 mg by mouth daily.  Marland Kitchen glipiZIDE (GLUCOTROL XL) 10 MG 24 hr tablet Take 20 mg by mouth daily with breakfast.  . lisinopril-hydrochlorothiazide (PRINZIDE,ZESTORETIC) 20-25 MG tablet Take 1 tablet by mouth daily.  . metFORMIN (GLUCOPHAGE) 1000 MG tablet Take 1,000-1,500  mg by mouth See admin instructions. 1000 mg in the morning, 1500 mg at dinner  . oxyCODONE-acetaminophen (PERCOCET) 10-325 MG tablet Take 1 tablet by mouth every 6 (six) hours as needed for pain.  Marland Kitchen PARoxetine (PAXIL) 40 MG tablet Take 40 mg by mouth every morning.  . simvastatin (ZOCOR) 20 MG tablet Take 20 mg by mouth at bedtime.    Allergies  Allergen Reactions  . Tetracyclines & Related Anaphylaxis    SOCIAL HISTORY/FAMILY HISTORY   Reviewed in Epic:  Pertinent findings:  Social History   Tobacco Use  . Smoking status: Former Smoker    Quit date: 2001    Years since quitting: 21.3  . Smokeless tobacco: Former Neurosurgeon    Quit date: 01/13/2009  . Tobacco comment: quit smoking 83yrs ago  Vaping Use  . Vaping Use: Never used  Substance Use Topics  . Alcohol use: Yes    Comment: once a month  . Drug use: No   Social History   Social History Narrative   Lifestyle:  married;   Residence:  lives with wife, and  granddaughter; has 4 children with 12 grandchildren.   Diet:  regular diet; Exercise:  no regular exercise-enjoys doing yard work-just not doing much now because he has not felt like it.  Feeling so tired.   ;  Occupation:  disabled; Previously worked as an Geneticist, molecular.          OBJCTIVE -PE, EKG, labs   Wt Readings from Last 3 Encounters:  07/27/20 215 lb 3.2 oz (97.6 kg)  06/12/20 220 lb (99.8 kg)  05/26/20 220 lb (99.8 kg)    Physical Exam: BP 104/72   Pulse 80   Ht 5\' 9"  (1.753 m)   Wt 215 lb 3.2 oz (97.6 kg)   SpO2 96%   BMI 31.78 kg/m  Physical Exam  He is obese. He is not ill-appearing or toxic-appearing.     Comments: Mildly disheveled.  HENT:     Head: Normocephalic and atraumatic.  Neck:     Vascular: No carotid bruit, hepatojugular reflux or JVD.  Cardiovascular:     Rate and Rhythm: Normal rate and regular rhythm.     Pulses: Normal pulses.     Heart sounds: Normal heart sounds. No murmur heard. No gallop.   Pulmonary:     Effort: Pulmonary effort is normal. No respiratory distress.     Breath sounds: Wheezing (Mild late expiratory ) present.     Comments: Mild diffuse interstitial sounds, no rales or rhonchi. Chest:     Chest wall: No tenderness.  Abdominal:     General: Bowel sounds are normal. There is distension.     Palpations: There is no mass.     Tenderness: There is abdominal tenderness (Epigastric to left upper quadrant distention and tenderness.). There is no guarding    Adult ECG Report  Rate: *** ;  Rhythm: {rhythm:17366};   Narrative Interpretation: ***  Recent Labs:  ***  No results found for: CHOL, HDL, LDLCALC, LDLDIRECT, TRIG, CHOLHDL Lab Results  Component Value Date   CREATININE 0.88 05/19/2020   BUN 12 05/19/2020   NA 139 05/19/2020   K 4.8 05/19/2020   CL 99 05/19/2020   CO2 22 05/19/2020   CBC Latest Ref Rng & Units 01/23/2019 01/14/2019 10/01/2017  WBC 4.0 - 10.5 K/uL - 9.1 7.1  Hemoglobin 13.0 - 17.0 g/dL 12.3(L)  14.1 14.5  Hematocrit 39.0 - 52.0 % 38.8(L) 43.1 43.3  Platelets 150 -  400 K/uL - 219 210    No results found for: TSH  ==================================================  COVID-19 Education: The signs and symptoms of COVID-19 were discussed with the patient and how to seek care for testing (follow up with PCP or arrange E-visit).   The importance of social distancing and COVID-19 vaccination was discussed today. The patient {ACTION; IS/IS [id number]} practicing social distancing & Masking.   I spent a total of ***minutes with the patient spent in direct patient consultation.  Additional time spent with chart review  / charting (studies, outside notes, etc): *** min Total Time: *** min   Current medicines are reviewed at length with the patient today.  (+/- concerns) ***  This visit occurred during the SARS-CoV-2 public health emergency.  Safety protocols were in place, including screening questions prior to the visit, additional usage of staff PPE, and extensive cleaning of exam room while observing appropriate contact time as indicated for disinfecting solutions.  Notice: This dictation was prepared with Dragon dictation along with smaller phrase technology. Any transcriptional errors that result from this process are unintentional and may not be corrected upon review.  Patient Instructions / Medication Changes & Studies & Tests Ordered   There are no Patient Instructions on file for this visit.   Studies Ordered:   No orders of the defined types were placed in this encounter.    Bryan Lemma, M.D., M.S. Interventional Cardiologist   Pager # [phone] Phone # [phone] [address] [place], [place] [address]   Thank you for choosing Heartcare at [facility]!!

## 2020-07-27 NOTE — Progress Notes (Signed)
Primary Care Provider: Kaleen MaskElkins, Wilson Oliver, MD Cardiologist: None Electrophysiologist: None  Clinic Note: Chief Complaint  Patient presents with  . Follow-up    Monitor results  . SVT    Monitor shows multiple episodes of SVT.  Marland Kitchen. Chest Pain    Atypical epigastric discomfort-thought to be associated with SVT.    ===================================  ASSESSMENT/PLAN   Problem List Items Addressed This Visit    Chronic epigastric pain    Work-up so far shows likely not ischemic CAD related based on negative Myoview, and is persistent despite aggressive treatment of GERD and H. pylori.    Symptoms are consistent with bursts of SVT with a fluttering sensation seeming to be a major component.        Hyperlipidemia (Chronic)    He is on simvastatin.  No recent labs available.  With negative Myoview, we can be less aggressive.  Target LDL less than 100.      Paroxysmal SVT (supraventricular tachycardia) (HCC) - Primary (Chronic)    122 episodes of SVT noted in 12 days.  Longest was just over 1 and half hours..  Developed frequency that he is feeling the symptoms dose is being that the culprit for symptoms of Spoke fluttering sensation and him feeling nauseated.    I have a little leery of treating SVT when there was a 5-second pause also noted.  At this point, I am going to consult electrophysiology to help determine course of action and treatment.  I would suspect that SVT ablation would be a reasonable option.  Had not previously listed he was on Adderall.  We will hold his Adderall now as it could be driving some of his symptoms, but will defer the further management to EP.         ===================================  HPI:    [name] is a 65 y.o. male with a PMH notable for DM-2, HTN, HLD and obesity who presents today for 8571-month follow-up for episodic CHEST/EPIGASTRIC DISCOMFORT, FATIGUE AND WEAKNESS AND RAPID PALPITATIONS -> Myoview and monitor results.  [name] was last seen on 05/26/2020 following echocardiogram evaluation.  He was still having significant symptoms of a sharp pulsating discomfort in his epigastric area (chest.  Not associate with eating or exertion, can happen at all times during the day.  He then described a sensation of fluttering in his chest.  He will get short of breath with the spells and noted there is some irregular heartbeat episodes.  He could not truly tell if the symptoms are made worse with exertion or not, but felt like the fluttering became faster.  Spells can last anywhere from a few minutes to an hour.  Usually the episodes are followed by generalized fatigue. -> Thinking that the symptoms could potentially be anginal, or potentially arrhythmogenic such as SVT.  We ordered a Myoview stress test and 14-day Zio patch monitor.  Recent Hospitalizations: None  Reviewed  CV studies:    The following studies were reviewed today: (if available, images/films reviewed: From Epic Chart or Care Everywhere)  . Myoview 06/12/2020:: Normal EF 60%.  No ischemia or infarction.  Normal study. . Echo 05/19/2020: Heart rate was 130 bpm.  LVEF 60 to 65%.  Mild aortic valve calcification with no stenosis.  Normal atrial sizes.  Mildly elevated PAP.  Noted to be in SVT during part of the study . Monitor 07/04/2020: 13-day  Predominant rhythm was sinus rhythm: Minimal heart rate 43 bpm, maximum 139 bpm,average 79  bpm.  122 Supraventricular Tachycardia runs occurred, the run with the fastest interval lasting 59.9 9 secs with a max rate of 174 bpm, the longest lasting 1 hour 36 mins with an avg rate of 124 bpm.  1 Pause occurred lasting 5.4 secs (12 bpm). Pause(s) occurred due to Possible High Grade AV Block. -> Prolonged PR interval none asystole for 3 block P waves. (This was at 4:30 in the AM)  Symptoms are definitely noted with SVT. Not a pause.  Other than the SVT episodes and pause, there were rare PACs -> mostly isolated, but also  some couplets and triplets.  Very rare (only ~50) PVCs noted. No couplets 1 triplet  Interval History:   [name] returns today noting that he is having a lot of his "episodes ".  In the last month it seems to have picked up.  When I discussed what SVT is, he says he definitely felt the symptoms.  Not all the ones that were seen on the monitor, clearly but probably the ones that lasted least 5 to 10 minutes.  He certainly remembers the episode that lasted an hour and a half.  He still is having spells where he feels like his heart rate goes fast and his blood pressure goes down.  He does get lightheaded with this but not any near-syncope or syncope. He definitely reiterates a sensation of of the irregular fast heartbeat spells associated with shortness of breath and a sharp pain in his epigastrium.  No real nausea or vomiting.  No relief with treating for GERD.  CV Review of Symptoms (Summary): positive for - chest pain, dyspnea on exertion, irregular heartbeat, rapid heart rate, shortness of breath and Fluttering sensation in the epigastric area, forceful heartbeats.  Followed by fatigue.  Somewhat worsened by exertion.  Mild nausea but no vomiting. negative for - edema, loss of consciousness, orthopnea, paroxysmal nocturnal dyspnea or Although lightheaded, no near syncope.  No TIA/amaurosis fugax or claudication.  The patient does not have symptoms concerning for COVID-19 infection (fever, chills, cough, or new shortness of breath).   REVIEWED OF SYSTEMS   Review of Systems  Constitutional: Positive for malaise/fatigue. Negative for weight loss.  Respiratory: Positive for shortness of breath (Both at rest and exertion - see above.).   Cardiovascular: Positive for chest pain (Sharp-fluttering sensation.) and palpitations. Negative for leg swelling.  Gastrointestinal: Positive for heartburn (The symptoms have improved after treating H. pylori.) and nausea (After having the fluttering epigastric  sensation). Negative for abdominal pain, blood in stool and melena.  Genitourinary: Negative for dysuria and hematuria.       Does have nocturia.  Musculoskeletal: Positive for joint pain. Negative for falls.  Neurological: Positive for dizziness, weakness (Generalized) and headaches. Negative for focal weakness.  Psychiatric/Behavioral: Negative for depression and memory loss. The patient is nervous/anxious and has insomnia.    I have reviewed and (if needed) personally updated the patient's problem list, medications, allergies, past medical and surgical history, social and family history.   PAST MEDICAL HISTORY   Past Medical History:  Diagnosis Date  . Aortic atherosclerosis (HCC) 01/06/2018   Noted on CT scan  . Carpal tunnel syndrome    left hand  . Chronic back pain   . Depression    takes Paxil daily  . Diabetes mellitus without complication (HCC)    Now on glipizide  . Diverticulosis   . Gastric ulcer   . History of colon polyps   . History of  gout   . History of staph infection 21yrs ago  . Hyperlipidemia    takes Pravastatin daily  . Hypertension    takes Prinzide daily  . Obesity (BMI 30-39.9) 01/06/2018  . Pain in joint of right shoulder 08/06/2017  . Pneumonia 2012  . Sleep apnea    sleep study in epic from 2010;doesn't use CPAP    PAST SURGICAL HISTORY   Past Surgical History:  Procedure Laterality Date  . ANTERIOR CERVICAL DECOMP/DISCECTOMY FUSION N/A 11/25/2012   Procedure: ANTERIOR CERVICAL DECOMPRESSION/DISCECTOMY FUSION C4- C6 2 LEVEL;  Surgeon: Venita Lick, MD;  Location: [facility] OR;  Service: Orthopedics;  Laterality: N/A;  . APPENDECTOMY    . BACK SURGERY    . COLONOSCOPY    . ESOPHAGOGASTRODUODENOSCOPY    . EVENT MONITOR  07/04/2020   Predominant rhythm: Sinus -> min 43 bpm, max 139 bpm, avg 79 bpm. 122 episodes of SVT :fastest ~60 seconds - 174 bpm, longest 1:36 hr - ~124 bpm.  Multiple episodes lasted less than 1 minute.  1 pause lasting 5.4 seconds  occurred likely due to high-grade AV block.  (P waves 1).-4:30 AM -> symptoms noted with SVT.  Not with pause.  Very rare PVCs.  Marland Kitchen left finger surgery    . NM MYOVIEW LTD  10/09/2017   For chest pain:EKG normal, images no evidence of ischemia or infarction EF 67%.  Reduced exercise tolerance.  Marland Kitchen NM MYOVIEW LTD  06/12/2020   Lexiscan: Normal EF 60%.  No ischemia or infarction.  Normal study.  LOW RISK  . right carpal tunnel surgery    . right knee arthroscopy    . right thumb surgery     fused  . SHOULDER SURGERY Right   . skin cancer spot removed a month ago    . TOTAL SHOULDER ARTHROPLASTY Right 01/22/2019   Procedure: TOTAL SHOULDER ARTHROPLASTY;  Surgeon: Beverely Low, MD;  Location: [facility];  Service: Orthopedics;  Laterality: Right;  interscalene block  . TRANSTHORACIC ECHOCARDIOGRAM  05/19/2020   Heart rate was 130 bpm.  LVEF 60 to 65%.  Mild aortic valve calcification with no stenosis.  Normal atrial sizes.  Mildly elevated PAP.     There is no immunization history on file for this patient.  MEDICATIONS/ALLERGIES   Current Meds  Medication Sig  . glipiZIDE (GLUCOTROL XL) 10 MG 24 hr tablet Take 20 mg by mouth daily with breakfast.  . lisinopril-hydrochlorothiazide (PRINZIDE,ZESTORETIC) 20-25 MG tablet Take 1 tablet by mouth daily.  . metFORMIN (GLUCOPHAGE) 1000 MG tablet Take 1,000-1,500 mg by mouth See admin instructions. 1000 mg in the morning, 1500 mg at dinner  . oxyCODONE-acetaminophen (PERCOCET) 10-325 MG tablet Take 1 tablet by mouth every 6 (six) hours as needed for pain.  Marland Kitchen PARoxetine (PAXIL) 40 MG tablet Take 40 mg by mouth every morning.  . simvastatin (ZOCOR) 20 MG tablet Take 20 mg by mouth at bedtime.  . [DISCONTINUED] amphetamine-dextroamphetamine (ADDERALL) 20 MG tablet Take 20 mg by mouth daily.    Allergies  Allergen Reactions  . Tetracyclines & Related Anaphylaxis    SOCIAL HISTORY/FAMILY HISTORY   Reviewed in Epic:  Pertinent findings:  Social  History   Tobacco Use  . Smoking status: Former Smoker    Quit date: 2001    Years since quitting: 21.3  . Smokeless tobacco: Former Neurosurgeon    Quit date: 01/13/2009  . Tobacco comment: quit smoking 54yrs ago  Vaping Use  . Vaping Use: Never used  Substance Use Topics  .  Alcohol use: Yes    Comment: once a month  . Drug use: No   Social History   Social History Narrative   Lifestyle:  married;   Residence:  lives with wife, and granddaughter; has 4 children with 12 grandchildren.   Diet:  regular diet; Exercise:  no regular exercise-enjoys doing yard work-just not doing much now because he has not felt like it.  Feeling so tired.   ;  Occupation:  disabled; Previously worked as an Geneticist, molecular.          OBJCTIVE -PE, EKG, labs   Wt Readings from Last 3 Encounters:  07/27/20 215 lb 3.2 oz (97.6 kg)  06/12/20 220 lb (99.8 kg)  05/26/20 220 lb (99.8 kg)    Physical Exam: BP 104/72   Pulse 80   Ht 5\' 9"  (1.753 m)   Wt 215 lb 3.2 oz (97.6 kg)   SpO2 96%   BMI 31.78 kg/m  Physical Exam Constitutional:      General: He is not in acute distress.    Appearance: Normal appearance. He is obese. He is not ill-appearing, toxic-appearing or diaphoretic.     Comments: Well-groomed.  Healthy-appearing.  HENT:     Head: Normocephalic and atraumatic.  Neck:     Vascular: No carotid bruit.  Cardiovascular:     Rate and Rhythm: Normal rate and regular rhythm.     Pulses: Normal pulses.     Heart sounds: Normal heart sounds. No murmur heard. No friction rub. No gallop.   Pulmonary:     Effort: Pulmonary effort is normal. No respiratory distress.     Breath sounds: Wheezing (Mild delayed expiratory wheeze, but not significant.) present.  Chest:     Chest wall: No tenderness.  Abdominal:     General: Bowel sounds are normal. There is no distension.     Tenderness: There is no abdominal tenderness.  Musculoskeletal:        General: No swelling. Normal range of motion.      Cervical back: Normal range of motion and neck supple.  Neurological:     General: No focal deficit present.     Mental Status: He is alert and oriented to person, place, and time.  Psychiatric:        Mood and Affect: Mood normal.        Behavior: Behavior normal.        Thought Content: Thought content normal.        Judgment: Judgment normal.      Adult ECG Report N/A  Recent Labs: Reviewed No results found for: CHOL, HDL, LDLCALC, LDLDIRECT, TRIG, CHOLHDL Lab Results  Component Value Date   CREATININE 0.88 05/19/2020   BUN 12 05/19/2020   NA 139 05/19/2020   K 4.8 05/19/2020   CL 99 05/19/2020   CO2 22 05/19/2020   CBC Latest Ref Rng & Units 01/23/2019 01/14/2019 10/01/2017  WBC 4.0 - 10.5 K/uL - 9.1 7.1  Hemoglobin 13.0 - 17.0 g/dL 12.3(L) 14.1 14.5  Hematocrit 39.0 - 52.0 % 38.8(L) 43.1 43.3  Platelets 150 - 400 K/uL - 219 210    No results found for: TSH  ==================================================  COVID-19 Education: The signs and symptoms of COVID-19 were discussed with the patient and how to seek care for testing (follow up with PCP or arrange E-visit).   The importance of social distancing and COVID-19 vaccination was discussed today. The patient is practicing social distancing & Masking.   I spent a total  of with the patient spent in direct patient consultation.  Additional time spent with chart review  / charting (studies, outside notes, etc): 12 min Total Time: 52 min   Current medicines are reviewed at length with the patient today.  (+/- concerns) n/a  This visit occurred during the SARS-CoV-2 public health emergency.  Safety protocols were in place, including screening questions prior to the visit, additional usage of staff PPE, and extensive cleaning of exam room while observing appropriate contact time as indicated for disinfecting solutions.  Notice: This dictation was prepared with Dragon dictation along with smaller phrase  technology. Any transcriptional errors that result from this process are unintentional and may not be corrected upon review.  Patient Instructions / Medication Changes & Studies & Tests Ordered   Patient Instructions  Medication Instructions:    stop taking Adderall  *If you need a refill on your cardiac medications before your next appointment, please call your pharmacy*   Lab Work: No changes   Testing/Procedures: notneeded   Follow-Up: At [facility], you and your health needs are our priority.  As part of our continuing mission to provide you with exceptional heart care, we have created designated Provider Care Teams.  These Care Teams include your primary Cardiologist (physician) and Advanced Practice Providers (APPs -  Physician Assistants and Nurse Practitioners) who all work together to provide you with the care you need, when you need it.  We recommend signing up for the patient portal called "MyChart".  Sign up information is provided on this After Visit Summary.  MyChart is used to connect with patients for Virtual Visits (Telemedicine).  Patients are able to view lab/test results, encounter notes, upcoming appointments, etc.  Non-urgent messages can be sent to your provider as well.   To learn more about what you can do with MyChart, go to [email or web address].    Your next appointment:   4 month(s)  The format for your next appointment:   In Person  Provider:   Bryan Lemma, MD  Keep appointment with Dr Ladona Ridgel for tomorrow      Studies Ordered:   No orders of the defined types were placed in this encounter.    Bryan Lemma, M.D., M.S. Interventional Cardiologist   Pager # [phone] Phone # [phone] [address] [place], [place] [address]   Thank you for choosing Heartcare at [facility]!!

## 2020-07-28 ENCOUNTER — Encounter: Payer: Self-pay | Admitting: Internal Medicine

## 2020-07-28 ENCOUNTER — Ambulatory Visit (INDEPENDENT_AMBULATORY_CARE_PROVIDER_SITE_OTHER): Payer: Medicare Other | Admitting: Internal Medicine

## 2020-07-28 VITALS — BP 116/70 | HR 71 | Ht 69.0 in | Wt 218.4 lb

## 2020-07-28 DIAGNOSIS — I471 Supraventricular tachycardia: Secondary | ICD-10-CM | POA: Diagnosis not present

## 2020-07-28 LAB — CBC WITH DIFFERENTIAL/PLATELET
Basophils Absolute: 0.1 10*3/uL (ref 0.0–0.2)
Basos: 1 %
EOS (ABSOLUTE): 0.2 10*3/uL (ref 0.0–0.4)
Eos: 4 %
Hematocrit: 40.1 % (ref 37.5–51.0)
Hemoglobin: 13.9 g/dL (ref 13.0–17.7)
Immature Grans (Abs): 0 10*3/uL (ref 0.0–0.1)
Immature Granulocytes: 0 %
Lymphocytes Absolute: 3.4 10*3/uL — ABNORMAL HIGH (ref 0.7–3.1)
Lymphs: 52 %
MCH: 33.4 pg — ABNORMAL HIGH (ref 26.6–33.0)
MCHC: 34.7 g/dL (ref 31.5–35.7)
MCV: 96 fL (ref 79–97)
Monocytes Absolute: 0.6 10*3/uL (ref 0.1–0.9)
Monocytes: 9 %
Neutrophils Absolute: 2.2 10*3/uL (ref 1.4–7.0)
Neutrophils: 34 %
Platelets: 211 10*3/uL (ref 150–450)
RBC: 4.16 x10E6/uL (ref 4.14–5.80)
RDW: 12.1 % (ref 11.6–15.4)
WBC: 6.4 10*3/uL (ref 3.4–10.8)

## 2020-07-28 LAB — BASIC METABOLIC PANEL
BUN/Creatinine Ratio: 20 (ref 10–24)
BUN: 18 mg/dL (ref 8–27)
CO2: 21 mmol/L (ref 20–29)
Calcium: 9.5 mg/dL (ref 8.6–10.2)
Chloride: 101 mmol/L (ref 96–106)
Creatinine, Ser: 0.91 mg/dL (ref 0.76–1.27)
Glucose: 274 mg/dL — ABNORMAL HIGH (ref 65–99)
Potassium: 4.6 mmol/L (ref 3.5–5.2)
Sodium: 136 mmol/L (ref 134–144)
eGFR: 94 mL/min/{1.73_m2} (ref >59)

## 2020-07-28 NOTE — Assessment & Plan Note (Signed)
122 episodes of SVT noted in 12 days.  Longest was just over 1 and half hours..  Developed frequency that he is feeling the symptoms dose is being that the culprit for symptoms of Spoke fluttering sensation and him feeling nauseated.    I have a little leery of treating SVT when there was a 5-second pause also noted.  At this point, I am going to consult electrophysiology to help determine course of action and treatment.  I would suspect that SVT ablation would be a reasonable option.  Had not previously listed he was on Adderall.  We will hold his Adderall now as it could be driving some of his symptoms, but will defer the further management to EP.

## 2020-07-28 NOTE — Assessment & Plan Note (Signed)
He is on simvastatin.  No recent labs available.  With negative Myoview, we can be less aggressive.  Target LDL less than 100.

## 2020-07-28 NOTE — Patient Instructions (Addendum)
Medication Instructions:  Your physician recommends that you continue on your current medications as directed. Please refer to the Current Medication list given to you today.  Labwork: You will get lab work today:  BMP and CBC  Testing/Procedures: None ordered.  Follow-Up:  SEE INSTRUCTION LETTER  Any Other Special Instructions Will Be Listed Below (If Applicable).  If you need a refill on your cardiac medications before your next appointment, please call your pharmacy.    Cardiac electrophysiology: From cell to bedside (7th ed., pp. 1239-1252). [place], [place]: Elsevier.">  Cardiac Ablation Cardiac ablation is a procedure to destroy, or ablate, a small amount of heart tissue in very specific places. The heart has many electrical connections. Sometimes these connections are abnormal and can cause the heart to beat very fast or irregularly. Ablating some of the areas that cause problems can improve the heart's rhythm or return it to normal. Ablation may be done for people who:  Have Wolff-Parkinson-White syndrome.  Have fast heart rhythms (tachycardia).  Have taken medicines for an abnormal heart rhythm (arrhythmia) that were not effective or caused side effects.  Have a high-risk heartbeat that may be life-threatening. During the procedure, a small incision is made in the neck or the groin, and a long, thin tube (catheter) is inserted into the incision and moved to the heart. Small devices (electrodes) on the tip of the catheter will send out electrical currents. A type of X-ray (fluoroscopy) will be used to help guide the catheter and to provide images of the heart. Tell a health care provider about:  Any allergies you have.  All medicines you are taking, including vitamins, herbs, eye drops, creams, and over-the-counter medicines.  Any problems you or family members have had with anesthetic medicines.  Any blood disorders you have.  Any surgeries you have had.  Any  medical conditions you have, such as kidney failure.  Whether you are pregnant or may be pregnant. What are the risks? Generally, this is a safe procedure. However, problems may occur, including:  Infection.  Bruising and bleeding at the catheter insertion site.  Bleeding into the chest, especially into the sac that surrounds the heart. This is a serious complication.  Stroke or blood clots.  Damage to nearby structures or organs.  Allergic reaction to medicines or dyes.  Need for a permanent pacemaker if the normal electrical system is damaged. A pacemaker is a small computer that sends electrical signals to the heart and helps your heart beat normally.  The procedure not being fully effective. This may not be recognized until months later. Repeat ablation procedures are sometimes done. What happens before the procedure? Medicines Ask your health care provider about:  Changing or stopping your regular medicines. This is especially important if you are taking diabetes medicines or blood thinners.  Taking medicines such as aspirin and ibuprofen. These medicines can thin your blood. Do not take these medicines unless your health care provider tells you to take them.  Taking over-the-counter medicines, vitamins, herbs, and supplements. General instructions  Follow instructions from your health care provider about eating or drinking restrictions.  Plan to have someone take you home from the hospital or clinic.  If you will be going home right after the procedure, plan to have someone with you for 24 hours.  Ask your health care provider what steps will be taken to prevent infection. What happens during the procedure?  An IV will be inserted into one of your veins.  You will be   given a medicine to help you relax (sedative).  The skin on your neck or groin will be numbed.  An incision will be made in your neck or your groin.  A needle will be inserted through the incision  and into a large vein in your neck or groin.  A catheter will be inserted into the needle and moved to your heart.  Dye may be injected through the catheter to help your surgeon see the area of the heart that needs treatment.  Electrical currents will be sent from the catheter to ablate heart tissue in desired areas. There are three types of energy that may be used to do this: ? Heat (radiofrequency energy). ? Laser energy. ? Extreme cold (cryoablation).  When the tissue has been ablated, the catheter will be removed.  Pressure will be held on the insertion area to prevent a lot of bleeding.  A bandage (dressing) will be placed over the insertion area. The exact procedure may vary among health care providers and hospitals.   What happens after the procedure?  Your blood pressure, heart rate, breathing rate, and blood oxygen level will be monitored until you leave the hospital or clinic.  Your insertion area will be monitored for bleeding. You will need to lie still for a few hours to ensure that you do not bleed from the insertion area.  Do not drive for 24 hours or as long as told by your health care provider. Summary  Cardiac ablation is a procedure to destroy, or ablate, a small amount of heart tissue using an electrical current. This procedure can improve the heart rhythm or return it to normal.  Tell your health care provider about any medical conditions you may have and all medicines you are taking to treat them.  This is a safe procedure, but problems may occur. Problems may include infection, bruising, damage to nearby organs or structures, or allergic reactions to medicines.  Follow your health care provider's instructions about eating and drinking before the procedure. You may also be told to change or stop some of your medicines.  After the procedure, do not drive for 24 hours or as long as told by your health care provider. This information is not intended to replace  advice given to you by your health care provider. Make sure you discuss any questions you have with your health care provider. Document Revised: 01/18/2019 Document Reviewed: 01/18/2019 Elsevier Patient Education  2021 Elsevier Inc.   

## 2020-07-28 NOTE — Progress Notes (Signed)
HPI [name] is referred today by Dr. Herbie Baltimore for evaluation of SVT. He is a pleasant 65 yo man with a h/o palpitations dating back to adolescence. He noted that initially the episodes were infrequent and felt to be panic attacks. He has had worsening of his symptoms. He was found on cardiac monitoring to have SVT at up to 174/min. In additiona he had nocturnal pauses of up to 4 seconds. He has associated sob with exertion. He has not had syncope. Because of his bradycardia, he has not been placed on a beta blocker.  Allergies  Allergen Reactions  . Tetracyclines & Related Anaphylaxis     Current Outpatient Medications  Medication Sig Dispense Refill  . glipiZIDE (GLUCOTROL XL) 10 MG 24 hr tablet Take 20 mg by mouth daily with breakfast.    . lisinopril-hydrochlorothiazide (PRINZIDE,ZESTORETIC) 20-25 MG tablet Take 1 tablet by mouth daily.    . metFORMIN (GLUCOPHAGE) 1000 MG tablet Take 1,000-1,500 mg by mouth See admin instructions. 1000 mg in the morning, 1500 mg at dinner    . oxyCODONE-acetaminophen (PERCOCET) 10-325 MG tablet Take 1 tablet by mouth every 6 (six) hours as needed for pain.    Marland Kitchen PARoxetine (PAXIL) 40 MG tablet Take 40 mg by mouth every morning.    . simvastatin (ZOCOR) 20 MG tablet Take 20 mg by mouth at bedtime.     No current facility-administered medications for this visit.     Past Medical History:  Diagnosis Date  . Aortic atherosclerosis (HCC) 01/06/2018   Noted on CT scan  . Carpal tunnel syndrome    left hand  . Chronic back pain   . Depression    takes Paxil daily  . Diabetes mellitus without complication (HCC)    Now on glipizide  . Diverticulosis   . Gastric ulcer   . History of colon polyps   . History of gout   . History of staph infection 82yrs ago  . Hyperlipidemia    takes Pravastatin daily  . Hypertension    takes Prinzide daily  . Obesity (BMI 30-39.9) 01/06/2018  . Pain in joint of right shoulder 08/06/2017  . Pneumonia 2012  .  Sleep apnea    sleep study in epic from 2010;doesn't use CPAP    ROS:   All systems reviewed and negative except as noted in the HPI.   Past Surgical History:  Procedure Laterality Date  . ANTERIOR CERVICAL DECOMP/DISCECTOMY FUSION N/A 11/25/2012   Procedure: ANTERIOR CERVICAL DECOMPRESSION/DISCECTOMY FUSION C4- C6 2 LEVEL;  Surgeon: Venita Lick, MD;  Location: [facility];  Service: Orthopedics;  Laterality: N/A;  . APPENDECTOMY    . BACK SURGERY    . COLONOSCOPY    . ESOPHAGOGASTRODUODENOSCOPY    . EVENT MONITOR  07/04/2020   Predominant rhythm: Sinus -> min 43 bpm, max 139 bpm, avg 79 bpm. 122 episodes of SVT :fastest ~60 seconds - 174 bpm, longest 1:36 hr - ~124 bpm.  Multiple episodes lasted less than 1 minute.  1 pause lasting 5.4 seconds occurred likely due to high-grade AV block.  (P waves 1).-4:30 AM -> symptoms noted with SVT.  Not with pause.  Very rare PVCs.  Marland Kitchen left finger surgery    . NM MYOVIEW LTD  10/09/2017   For chest pain:EKG normal, images no evidence of ischemia or infarction EF 67%.  Reduced exercise tolerance.  Marland Kitchen NM MYOVIEW LTD  06/12/2020   Lexiscan: Normal EF 60%.  No ischemia or infarction.  Normal study.  LOW RISK  . right carpal tunnel surgery    . right knee arthroscopy    . right thumb surgery     fused  . SHOULDER SURGERY Right   . skin cancer spot removed a month ago    . TOTAL SHOULDER ARTHROPLASTY Right 01/22/2019   Procedure: TOTAL SHOULDER ARTHROPLASTY;  Surgeon: Norris, Steve, MD;  Location: [facility];  Service: Orthopedics;  Laterality: Right;  interscalene block  . TRANSTHORACIC ECHOCARDIOGRAM  05/19/2020   Heart rate was 130 bpm.  LVEF 60 to 65%.  Mild aortic valve calcification with no stenosis.  Normal atrial sizes.  Mildly elevated PAP.     Family History  Problem Relation Age of Onset  . Heart disease Mother        Pacemaker  . Heart disease Sister        He is not sure the details  . COPD Brother      Social History   Socioeconomic  History  . Marital status: Married    Spouse name: Not on file  . Number of children: Not on file  . Years of education: Not on file  . Highest education level: 11th grade  Occupational History  . Occupation: Disabled  Tobacco Use  . Smoking status: Former Smoker    Quit date: 2001    Years since quitting: 21.3  . Smokeless tobacco: Former User    Quit date: 01/13/2009  . Tobacco comment: quit smoking 12yrs ago  Vaping Use  . Vaping Use: Never used  Substance and Sexual Activity  . Alcohol use: Yes    Comment: once a month  . Drug use: No  . Sexual activity: Yes  Other Topics Concern  . Not on file  Social History Narrative   Lifestyle:  married;   Residence:  lives with wife, and granddaughter; has 4 children with 12 grandchildren.   Diet:  regular diet; Exercise:  no regular exercise-enjoys doing yard work-just not doing much now because he has not felt like it.  Feeling so tired.   ;  Occupation:  disabled; Previously worked as an HVAC installer.         Social Determinants of Health   Financial Resource Strain: Not on file  Food Insecurity: Not on file  Transportation Needs: Not on file  Physical Activity: Not on file  Stress: Not on file  Social Connections: Not on file  Intimate Partner Violence: Not on file     BP 116/70   Pulse 71   Ht 5' 9" (1.753 m)   Wt 218 lb 6.4 oz (99.1 kg)   SpO2 97%   BMI 32.25 kg/m   Physical Exam:  Well appearing NAD HEENT: Unremarkable Neck:  No JVD, no thyromegally Lymphatics:  No adenopathy Back:  No CVA tenderness Lungs:  Clear with no wheezes HEART:  Regular rate rhythm, no murmurs, no rubs, no clicks Abd:  soft, positive bowel sounds, no organomegally, no rebound, no guarding Ext:  2 plus pulses, no edema, no cyanosis, no clubbing Skin:  No rashes no nodules Neuro:  CN II through XII intact, motor grossly intact  EKG - reviewed. NSR with no pre-excitation  Assess/Plan: 1. SVT - I have discussed the treatment  options with the patient and the risks/benefits/goals/expectations of EP study and catheter ablation were reviewed and he wishes to proceed. 2. Heart block - he has asymptomatic nocturnal bradycardia. We will follow. He is not on a beta blocker due to the   heart block. 3. Chest pain - as per Dr. Herbie Baltimore his symptoms are likely related to SVT.   Sharlot Gowda Paije Goodhart,MD

## 2020-07-28 NOTE — H&P (View-Only) (Signed)
HPI [name] is referred today by Dr. Herbie Baltimore for evaluation of SVT. He is a pleasant 65 yo man with a h/o palpitations dating back to adolescence. He noted that initially the episodes were infrequent and felt to be panic attacks. He has had worsening of his symptoms. He was found on cardiac monitoring to have SVT at up to 174/min. In additiona he had nocturnal pauses of up to 4 seconds. He has associated sob with exertion. He has not had syncope. Because of his bradycardia, he has not been placed on a beta blocker.  Allergies  Allergen Reactions  . Tetracyclines & Related Anaphylaxis     Current Outpatient Medications  Medication Sig Dispense Refill  . glipiZIDE (GLUCOTROL XL) 10 MG 24 hr tablet Take 20 mg by mouth daily with breakfast.    . lisinopril-hydrochlorothiazide (PRINZIDE,ZESTORETIC) 20-25 MG tablet Take 1 tablet by mouth daily.    . metFORMIN (GLUCOPHAGE) 1000 MG tablet Take 1,000-1,500 mg by mouth See admin instructions. 1000 mg in the morning, 1500 mg at dinner    . oxyCODONE-acetaminophen (PERCOCET) 10-325 MG tablet Take 1 tablet by mouth every 6 (six) hours as needed for pain.    Marland Kitchen PARoxetine (PAXIL) 40 MG tablet Take 40 mg by mouth every morning.    . simvastatin (ZOCOR) 20 MG tablet Take 20 mg by mouth at bedtime.     No current facility-administered medications for this visit.     Past Medical History:  Diagnosis Date  . Aortic atherosclerosis (HCC) 01/06/2018   Noted on CT scan  . Carpal tunnel syndrome    left hand  . Chronic back pain   . Depression    takes Paxil daily  . Diabetes mellitus without complication (HCC)    Now on glipizide  . Diverticulosis   . Gastric ulcer   . History of colon polyps   . History of gout   . History of staph infection 82yrs ago  . Hyperlipidemia    takes Pravastatin daily  . Hypertension    takes Prinzide daily  . Obesity (BMI 30-39.9) 01/06/2018  . Pain in joint of right shoulder 08/06/2017  . Pneumonia 2012  .  Sleep apnea    sleep study in epic from 2010;doesn't use CPAP    ROS:   All systems reviewed and negative except as noted in the HPI.   Past Surgical History:  Procedure Laterality Date  . ANTERIOR CERVICAL DECOMP/DISCECTOMY FUSION N/A 11/25/2012   Procedure: ANTERIOR CERVICAL DECOMPRESSION/DISCECTOMY FUSION C4- C6 2 LEVEL;  Surgeon: Venita Lick, MD;  Location: [facility];  Service: Orthopedics;  Laterality: N/A;  . APPENDECTOMY    . BACK SURGERY    . COLONOSCOPY    . ESOPHAGOGASTRODUODENOSCOPY    . EVENT MONITOR  07/04/2020   Predominant rhythm: Sinus -> min 43 bpm, max 139 bpm, avg 79 bpm. 122 episodes of SVT :fastest ~60 seconds - 174 bpm, longest 1:36 hr - ~124 bpm.  Multiple episodes lasted less than 1 minute.  1 pause lasting 5.4 seconds occurred likely due to high-grade AV block.  (P waves 1).-4:30 AM -> symptoms noted with SVT.  Not with pause.  Very rare PVCs.  Marland Kitchen left finger surgery    . NM MYOVIEW LTD  10/09/2017   For chest pain:EKG normal, images no evidence of ischemia or infarction EF 67%.  Reduced exercise tolerance.  Marland Kitchen NM MYOVIEW LTD  06/12/2020   Lexiscan: Normal EF 60%.  No ischemia or infarction.  Normal study.  LOW RISK  . right carpal tunnel surgery    . right knee arthroscopy    . right thumb surgery     fused  . SHOULDER SURGERY Right   . skin cancer spot removed a month ago    . TOTAL SHOULDER ARTHROPLASTY Right 01/22/2019   Procedure: TOTAL SHOULDER ARTHROPLASTY;  Surgeon: Beverely Low, MD;  Location: [facility];  Service: Orthopedics;  Laterality: Right;  interscalene block  . TRANSTHORACIC ECHOCARDIOGRAM  05/19/2020   Heart rate was 130 bpm.  LVEF 60 to 65%.  Mild aortic valve calcification with no stenosis.  Normal atrial sizes.  Mildly elevated PAP.     Family History  Problem Relation Age of Onset  . Heart disease Mother        Pacemaker  . Heart disease Sister        He is not sure the details  . COPD Brother      Social History   Socioeconomic  History  . Marital status: Married    Spouse name: Not on file  . Number of children: Not on file  . Years of education: Not on file  . Highest education level: 11th grade  Occupational History  . Occupation: Disabled  Tobacco Use  . Smoking status: Former Smoker    Quit date: 2001    Years since quitting: 21.3  . Smokeless tobacco: Former Neurosurgeon    Quit date: 01/13/2009  . Tobacco comment: quit smoking 51yrs ago  Vaping Use  . Vaping Use: Never used  Substance and Sexual Activity  . Alcohol use: Yes    Comment: once a month  . Drug use: No  . Sexual activity: Yes  Other Topics Concern  . Not on file  Social History Narrative   Lifestyle:  married;   Residence:  lives with wife, and granddaughter; has 4 children with 12 grandchildren.   Diet:  regular diet; Exercise:  no regular exercise-enjoys doing yard work-just not doing much now because he has not felt like it.  Feeling so tired.   ;  Occupation:  disabled; Previously worked as an Geneticist, molecular.         Social Determinants of Health   Financial Resource Strain: Not on file  Food Insecurity: Not on file  Transportation Needs: Not on file  Physical Activity: Not on file  Stress: Not on file  Social Connections: Not on file  Intimate Partner Violence: Not on file     BP 116/70   Pulse 71   Ht 5\' 9"  (1.753 m)   Wt 218 lb 6.4 oz (99.1 kg)   SpO2 97%   BMI 32.25 kg/m   Physical Exam:  Well appearing NAD HEENT: Unremarkable Neck:  No JVD, no thyromegally Lymphatics:  No adenopathy Back:  No CVA tenderness Lungs:  Clear with no wheezes HEART:  Regular rate rhythm, no murmurs, no rubs, no clicks Abd:  soft, positive bowel sounds, no organomegally, no rebound, no guarding Ext:  2 plus pulses, no edema, no cyanosis, no clubbing Skin:  No rashes no nodules Neuro:  CN II through XII intact, motor grossly intact  EKG - reviewed. NSR with no pre-excitation  Assess/Plan: 1. SVT - I have discussed the treatment  options with the patient and the risks/benefits/goals/expectations of EP study and catheter ablation were reviewed and he wishes to proceed. 2. Heart block - he has asymptomatic nocturnal bradycardia. We will follow. He is not on a beta blocker due to the  heart block. 3. Chest pain - as per Dr. Herbie Baltimore his symptoms are likely related to SVT.   Sharlot Gowda Hera Celaya,MD

## 2020-07-28 NOTE — Assessment & Plan Note (Signed)
Work-up so far shows likely not ischemic CAD related based on negative Myoview, and is persistent despite aggressive treatment of GERD and H. pylori.    Symptoms are consistent with bursts of SVT with a fluttering sensation seeming to be a major component.

## 2020-08-08 ENCOUNTER — Telehealth: Payer: Self-pay

## 2020-08-08 NOTE — Telephone Encounter (Signed)
Left message for Pt requesting call back.  1.  Let Pt know he no longer needs a covid test and it was cancelled for him  2.  Let Pt know that his arrival time for his procedure on August 24, 2020 is NOW 7:30 am

## 2020-08-09 ENCOUNTER — Telehealth: Payer: Self-pay | Admitting: Internal Medicine

## 2020-08-09 NOTE — Telephone Encounter (Signed)
    Pt called back, provide the message on the phone note from [place] yesterday. Pt understood and no further question

## 2020-08-10 NOTE — Telephone Encounter (Signed)
Pt aware.  See phone note.  No further action needed.

## 2020-08-22 ENCOUNTER — Other Ambulatory Visit (HOSPITAL_COMMUNITY): Payer: Medicare Other

## 2020-08-23 NOTE — Pre-Procedure Instructions (Signed)
Instructed patient on the following items: Arrival time 0730 Nothing to eat or drink after midnight No meds AM of procedure Responsible person to drive you home and stay with you for 24 hrs     

## 2020-08-24 ENCOUNTER — Encounter (HOSPITAL_COMMUNITY)
Admission: RE | Disposition: A | Discharge status: Home / Self Care | Payer: Self-pay | Source: Home / Self Care | Attending: Internal Medicine

## 2020-08-24 ENCOUNTER — Ambulatory Visit (HOSPITAL_COMMUNITY)
Admission: RE | Admit: 2020-08-24 | Discharge: 2020-08-24 | Disposition: A | Discharge status: Home / Self Care | Payer: Medicare Other | Attending: Internal Medicine | Admitting: Internal Medicine

## 2020-08-24 ENCOUNTER — Other Ambulatory Visit: Payer: Self-pay

## 2020-08-24 DIAGNOSIS — R001 Bradycardia, unspecified: Secondary | ICD-10-CM | POA: Diagnosis not present

## 2020-08-24 DIAGNOSIS — Z79899 Other long term (current) drug therapy: Secondary | ICD-10-CM | POA: Insufficient documentation

## 2020-08-24 DIAGNOSIS — I471 Supraventricular tachycardia: Secondary | ICD-10-CM | POA: Diagnosis not present

## 2020-08-24 DIAGNOSIS — Z7984 Long term (current) use of oral hypoglycemic drugs: Secondary | ICD-10-CM | POA: Diagnosis not present

## 2020-08-24 DIAGNOSIS — Z87891 Personal history of nicotine dependence: Secondary | ICD-10-CM | POA: Insufficient documentation

## 2020-08-24 DIAGNOSIS — Z881 Allergy status to other antibiotic agents status: Secondary | ICD-10-CM | POA: Diagnosis not present

## 2020-08-24 DIAGNOSIS — I459 Conduction disorder, unspecified: Secondary | ICD-10-CM | POA: Diagnosis not present

## 2020-08-24 HISTORY — PX: SVT ABLATION: EP1225

## 2020-08-24 LAB — GLUCOSE, CAPILLARY
Glucose-Capillary: 259 mg/dL — ABNORMAL HIGH (ref 70–99)
Glucose-Capillary: 217 mg/dL — ABNORMAL HIGH (ref 70–99)

## 2020-08-24 SURGERY — SVT ABLATION

## 2020-08-24 MED ORDER — BUPIVACAINE HCL (PF) 0.25 % IJ SOLN
INTRAMUSCULAR | Status: AC
  Filled 2020-08-24: qty 30

## 2020-08-24 MED ORDER — SODIUM CHLORIDE 0.9 % IV SOLN: INTRAVENOUS | Status: DC

## 2020-08-24 MED ORDER — MIDAZOLAM HCL 5 MG/5ML IJ SOLN
INTRAMUSCULAR | Status: AC
  Filled 2020-08-24: qty 5

## 2020-08-24 MED ORDER — FENTANYL CITRATE (PF) 100 MCG/2ML IJ SOLN
INTRAMUSCULAR | Status: AC
  Filled 2020-08-24: qty 2

## 2020-08-24 MED ORDER — MIDAZOLAM HCL 5 MG/5ML IJ SOLN
INTRAMUSCULAR | Status: DC | PRN
  Administered 2020-08-24: 1 mg via INTRAVENOUS
  Administered 2020-08-24: 2 mg via INTRAVENOUS
  Administered 2020-08-24 (×3): 1 mg via INTRAVENOUS

## 2020-08-24 MED ORDER — ONDANSETRON HCL 4 MG/2ML IJ SOLN: 4.0000 mg | Freq: Four times a day (QID) | INTRAMUSCULAR | Status: DC | PRN

## 2020-08-24 MED ORDER — BUPIVACAINE HCL (PF) 0.25 % IJ SOLN
INTRAMUSCULAR | Status: DC | PRN
  Administered 2020-08-24: 50 mL

## 2020-08-24 MED ORDER — ACETAMINOPHEN 325 MG PO TABS: 650.0000 mg | ORAL_TABLET | ORAL | Status: DC | PRN

## 2020-08-24 MED ORDER — FENTANYL CITRATE (PF) 100 MCG/2ML IJ SOLN
INTRAMUSCULAR | Status: DC | PRN
  Administered 2020-08-24 (×2): 12.5 ug via INTRAVENOUS
  Administered 2020-08-24: 25 ug via INTRAVENOUS
  Administered 2020-08-24 (×2): 12.5 ug via INTRAVENOUS

## 2020-08-24 MED ORDER — HEPARIN (PORCINE) IN NACL 1000-0.9 UT/500ML-% IV SOLN
INTRAVENOUS | Status: DC | PRN
  Administered 2020-08-24: 500 mL

## 2020-08-24 SURGICAL SUPPLY — 12 items
BAG SNAP BAND KOVER 36X36 (MISCELLANEOUS) ×2 IMPLANT
CATH EZ STEER NAV 4MM D-F CUR (ABLATOR) ×4 IMPLANT
CATH JOSEPH QUAD ALLRED 6F REP (CATHETERS) ×4 IMPLANT
CATH JSN HEX 2-5-2 120 (CATHETERS) ×2 IMPLANT
MAT PREVALON FULL STRYKER (MISCELLANEOUS) ×2 IMPLANT
PACK EP LATEX FREE (CUSTOM PROCEDURE TRAY) ×2
PACK EP LF (CUSTOM PROCEDURE TRAY) ×1 IMPLANT
PAD PRO RADIOLUCENT 2001M-C (PAD) ×2 IMPLANT
PATCH CARTO3 (PAD) ×2 IMPLANT
SHEATH PINNACLE 6F 10CM (SHEATH) ×6 IMPLANT
SHEATH PINNACLE 7F 10CM (SHEATH) ×2 IMPLANT
SHEATH PINNACLE 8F 10CM (SHEATH) ×2 IMPLANT

## 2020-08-24 NOTE — Progress Notes (Signed)
77fr, 57fr & 54fr sheaths were removed from RFV using manual was complert pressure.  Pressure was held for 10 minutes with no oozing or hematoma present during or after hold. A tegaderm was applied to the site and patient had NVS after hold was complete.

## 2020-08-24 NOTE — Progress Notes (Signed)
Site area: Right IJ 7 french venous sheath removed   Site Prior to Removal:  Level 0  Pressure Applied For 5 MINUTES    Bedrest Beginning at 1215pm  Manual:   Yes.    Patient Status During Pull:  stable  Post Pull Groin Site:  Level 0  Post Pull Instructions Given:  Yes.    Post Pull Pulses Present:  Yes.    Dressing Applied:  Yes.    Comments:

## 2020-08-24 NOTE — Discharge Instructions (Signed)

## 2020-08-24 NOTE — Progress Notes (Signed)
Ambulated in hallway tol well no bleeding noted before or after ambulation  

## 2020-08-24 NOTE — Interval H&P Note (Signed)
History and Physical Interval Note:  08/24/2020 9:22 AM  Mack Guise J Lees  has presented today for surgery, with the diagnosis of svt.  The various methods of treatment have been discussed with the patient and family. After consideration of risks, benefits and other options for treatment, the patient has consented to  Procedure(s): SVT ABLATION (N/A) as a surgical intervention.  The patient's history has been reviewed, patient examined, no change in status, stable for surgery.  I have reviewed the patient's chart and labs.  Questions were answered to the patient's satisfaction.     Lewayne Bunting

## 2020-08-25 ENCOUNTER — Encounter (HOSPITAL_COMMUNITY): Payer: Self-pay | Admitting: Internal Medicine

## 2020-09-28 ENCOUNTER — Encounter: Payer: Self-pay | Admitting: Internal Medicine

## 2020-09-28 ENCOUNTER — Ambulatory Visit (INDEPENDENT_AMBULATORY_CARE_PROVIDER_SITE_OTHER): Payer: Medicare Other | Admitting: Internal Medicine

## 2020-09-28 ENCOUNTER — Other Ambulatory Visit: Payer: Self-pay

## 2020-09-28 VITALS — BP 116/74 | HR 74 | Ht 69.0 in | Wt 221.6 lb

## 2020-09-28 DIAGNOSIS — I471 Supraventricular tachycardia: Secondary | ICD-10-CM

## 2020-09-28 DIAGNOSIS — I208 Other forms of angina pectoris: Secondary | ICD-10-CM | POA: Diagnosis not present

## 2020-09-28 NOTE — Patient Instructions (Addendum)
Medication Instructions:  Your physician recommends that you continue on your current medications as directed. Please refer to the Current Medication list given to you today.  Labwork: None ordered.  Testing/Procedures: None ordered.  Follow-Up: Your physician wants you to follow-up in: as needed with Gregg Taylor, MD    Any Other Special Instructions Will Be Listed Below (If Applicable).  If you need a refill on your cardiac medications before your next appointment, please call your pharmacy.       

## 2020-09-28 NOTE — Progress Notes (Signed)
HPI [name] returns today for followup. He is a pleasant 65 yo man with a h/o SVT who has undergone EP study and catheter ablation. He has done well in the interim. He denies chest pain or sob. No syncope. No palpitations. Allergies  Allergen Reactions   Tetracyclines & Related Anaphylaxis     Current Outpatient Medications  Medication Sig Dispense Refill   Cholecalciferol (VITAMIN D-3) 125 MCG (5000 UT) TABS Take 5,000 Units by mouth in the morning.     glipiZIDE (GLUCOTROL XL) 10 MG 24 hr tablet Take 20 mg by mouth daily with breakfast.     lisinopril-hydrochlorothiazide (PRINZIDE,ZESTORETIC) 20-25 MG tablet Take 0.5 tablets by mouth in the morning.     metFORMIN (GLUCOPHAGE) 1000 MG tablet Take 1,000-1,500 mg by mouth See admin instructions. Take 1 tablet (1000 mg) by mouth in the morning & take 1.5 tablets (1500 mg) by mouth at supper.     oxyCODONE (ROXICODONE) 15 MG immediate release tablet Take 15 mg by mouth every 6 (six) hours as needed for pain.     PARoxetine (PAXIL) 40 MG tablet Take 20 mg by mouth in the morning.     pioglitazone (ACTOS) 30 MG tablet Take 30 mg by mouth in the morning.     simvastatin (ZOCOR) 20 MG tablet Take 20 mg by mouth in the morning.     vitamin C (ASCORBIC ACID) 500 MG tablet Take 500 mg by mouth in the morning.     No current facility-administered medications for this visit.     Past Medical History:  Diagnosis Date   Aortic atherosclerosis (HCC) 01/06/2018   Noted on CT scan   Carpal tunnel syndrome    left hand   Chronic back pain    Depression    takes Paxil daily   Diabetes mellitus without complication (HCC)    Now on glipizide   Diverticulosis    Gastric ulcer    History of colon polyps    History of gout    History of staph infection 62yrs ago   Hyperlipidemia    takes Pravastatin daily   Hypertension    takes Prinzide daily   Obesity (BMI 30-39.9) 01/06/2018   Pain in joint of right shoulder 08/06/2017   Pneumonia  2012   Sleep apnea    sleep study in epic from 2010;doesn't use CPAP    ROS:   All systems reviewed and negative except as noted in the HPI.   Past Surgical History:  Procedure Laterality Date   ANTERIOR CERVICAL DECOMP/DISCECTOMY FUSION N/A 11/25/2012   Procedure: ANTERIOR CERVICAL DECOMPRESSION/DISCECTOMY FUSION C4- C6 2 LEVEL;  Surgeon: Venita Lick, MD;  Location: [facility];  Service: Orthopedics;  Laterality: N/A;   APPENDECTOMY     BACK SURGERY     COLONOSCOPY     ESOPHAGOGASTRODUODENOSCOPY     EVENT MONITOR  07/04/2020   Predominant rhythm: Sinus -> min 43 bpm, max 139 bpm, avg 79 bpm. 122 episodes of SVT :fastest ~60 seconds - 174 bpm, longest 1:36 hr - ~124 bpm.  Multiple episodes lasted less than 1 minute.  1 pause lasting 5.4 seconds occurred likely due to high-grade AV block.  (P waves 1).-4:30 AM -> symptoms noted with SVT.  Not with pause.  Very rare PVCs.   left finger surgery     NM MYOVIEW LTD  10/09/2017   For chest pain:EKG normal, images no evidence of ischemia or infarction EF 67%.  Reduced exercise tolerance.  NM MYOVIEW LTD  06/12/2020   Lexiscan: Normal EF 60%.  No ischemia or infarction.  Normal study.  LOW RISK   right carpal tunnel surgery     right knee arthroscopy     right thumb surgery     fused   SHOULDER SURGERY Right    skin cancer spot removed a month ago     SVT ABLATION N/A 08/24/2020   Procedure: SVT ABLATION;  Surgeon: Marinus Maw, MD;  Location: [facility];  Service: Cardiovascular;  Laterality: N/A;   TOTAL SHOULDER ARTHROPLASTY Right 01/22/2019   Procedure: TOTAL SHOULDER ARTHROPLASTY;  Surgeon: Beverely Low, MD;  Location: [facility];  Service: Orthopedics;  Laterality: Right;  interscalene block   TRANSTHORACIC ECHOCARDIOGRAM  05/19/2020   Heart rate was 130 bpm.  LVEF 60 to 65%.  Mild aortic valve calcification with no stenosis.  Normal atrial sizes.  Mildly elevated PAP.     Family History  Problem Relation Age of Onset    Heart disease Mother        Pacemaker   Heart disease Sister        He is not sure the details   COPD Brother      Social History   Socioeconomic History   Marital status: Married    Spouse name: Not on file   Number of children: Not on file   Years of education: Not on file   Highest education level: 11th grade  Occupational History   Occupation: Disabled  Tobacco Use   Smoking status: Former    Pack years: 0.00    Types: Cigarettes    Quit date: 2001    Years since quitting: 21.5   Smokeless tobacco: Former    Quit date: 01/13/2009   Tobacco comments:    quit smoking 42yrs ago  Vaping Use   Vaping Use: Never used  Substance and Sexual Activity   Alcohol use: Yes    Comment: once a month   Drug use: No   Sexual activity: Yes  Other Topics Concern   Not on file  Social History Narrative   Lifestyle:  married;   Residence:  lives with wife, and granddaughter; has 4 children with 12 grandchildren.   Diet:  regular diet; Exercise:  no regular exercise-enjoys doing yard work-just not doing much now because he has not felt like it.  Feeling so tired.   ;  Occupation:  disabled; Previously worked as an Geneticist, molecular.         Social Determinants of Health   Financial Resource Strain: Not on file  Food Insecurity: Not on file  Transportation Needs: Not on file  Physical Activity: Not on file  Stress: Not on file  Social Connections: Not on file  Intimate Partner Violence: Not on file     BP 116/74   Pulse 74   Ht 5\' 9"  (1.753 m)   Wt 221 lb 9.6 oz (100.5 kg)   SpO2 96%   BMI 32.72 kg/m   Physical Exam:  Well appearing NAD HEENT: Unremarkable Neck:  No JVD, no thyromegally Lymphatics:  No adenopathy Back:  No CVA tenderness Lungs:  Clear with no wheezes HEART:  Regular rate rhythm, no murmurs, no rubs, no clicks Abd:  soft, positive bowel sounds, no organomegally, no rebound, no guarding Ext:  2 plus pulses, no edema, no cyanosis, no clubbing Skin:   No rashes no nodules Neuro:  CN II through XII intact, motor grossly intact  EKG -  nsr  Assess/Plan:  SVT - he is s/p EPS/RFA of AVNRT and is doing well. He will followup as needed. He has had no recurrent symptoms. Nocturnal bradycardia - he is asymptomatic. We will follow as needed. Chest pain - resolved now that his SVT is controlled.  Sharlot Gowda Gaylan Fauver,MD

## 2020-12-01 ENCOUNTER — Ambulatory Visit: Payer: Medicare Other | Admitting: Cardiology

## 2021-01-29 ENCOUNTER — Ambulatory Visit: Payer: Medicare Other | Admitting: Cardiology

## 2021-01-29 NOTE — Progress Notes (Deleted)
Primary Care Provider: Leonard Downing, MD Cardiologist:  Dr. Ellyn Hack Electrophysiologist:  Dr. Lovena Le  Clinic Note: No chief complaint on file.   ===================================  ASSESSMENT/PLAN   Problem List Items Addressed This Visit       Cardiology Problems   Hyperlipidemia (Chronic)   Paroxysmal SVT (supraventricular tachycardia) (HCC) - Primary (Chronic)   ===================================  HPI:    [name] is a 65 y.o. male with a PMH notable for DM-2, HTN and HLD as well as obesity who was evaluated for unusual symptoms of chest/epigastric discomfort, fatigue and palpitations and was found to have PSVT, now s/p SVT ablation.  He presents today for 25-month follow-up..  I last saw [name] Jul 27, 2020 in follow-up from his Myoview, monitor and echocardiogram.  He noted that he was having a lot of "episodes" that seem to correlate exactly with his SVT.  Getting lightheaded and somewhat short of breath, no syncope or near syncope.  Fast heart rates with some strong discomfort in his epigastric area. Monitor showed 122 episodes of SVT in 12 days.  And lasted and over 1/2 hours and on occasion.  There was also a 5-second pause after one episode. => Referred to EP to consider ablation. Myoview and echocardiogram were both relatively normal. => He was seen by EP the next day and scheduled for ablation 1 month later on June 2  Recent Hospitalizations:  08/24/2020: SVT ablation  He was then seen by Dr. Lovena Le in follow-up after EPS with SVT ablation.  Noted he was doing well.  No further episodes of epigastric/chest discomfort, syncope or palpitations.  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  Plan was to monitor monitor on   Interval History:   [name]   CV Review of Symptoms  (Summary) Cardiovascular ROS: {roscv:310661}  REVIEWED OF SYSTEMS   ROS  I have reviewed and (if needed) personally updated the patient's problem list, medications, allergies, past medical and surgical history, social and family history.   PAST MEDICAL HISTORY   Past Medical History:  Diagnosis Date   Aortic atherosclerosis ([place]) 01/06/2018   Noted on CT scan   Carpal tunnel syndrome    left hand   Chronic back pain    Depression    takes Paxil daily   Diabetes mellitus without complication ([place])    Now on glipizide   Diverticulosis    Gastric ulcer    History of colon polyps    History of gout    History of staph infection 33yrs ago   Hyperlipidemia    takes Pravastatin daily   Hypertension    takes Prinzide daily   Obesity (BMI 30-39.9) 01/06/2018   Pain in joint of right shoulder 08/06/2017   Pneumonia 2012   Sleep apnea    sleep study in epic from 2010;doesn't use CPAP    PAST SURGICAL HISTORY   Past Surgical History:  Procedure Laterality Date   ANTERIOR CERVICAL DECOMP/DISCECTOMY FUSION N/A 11/25/2012   Procedure: ANTERIOR CERVICAL DECOMPRESSION/DISCECTOMY FUSION C4- C6 2 LEVEL;  Surgeon: Melina Schools, MD;  Location: [facility];  Service: Orthopedics;  Laterality: N/A;   APPENDECTOMY     BACK SURGERY     COLONOSCOPY     ESOPHAGOGASTRODUODENOSCOPY     EVENT MONITOR  07/04/2020   Predominant rhythm: Sinus -> min 43  bpm, max 139 bpm, avg 79 bpm. 122 episodes of SVT :fastest ~60 seconds - 174 bpm, longest 1:36 hr - ~124 bpm.  Multiple episodes lasted less than 1 minute.  1 pause lasting 5.4 seconds occurred likely due to high-grade AV block.  (P waves 1).-4:30 AM -> symptoms noted with SVT.  Not with pause.  Very rare PVCs.   left finger surgery     NM MYOVIEW LTD  10/09/2017   For chest pain:EKG normal, images no evidence of ischemia or infarction EF 67%.  Reduced exercise tolerance.   NM MYOVIEW LTD  06/12/2020   Lexiscan: Normal EF 60%.  No ischemia or infarction.   Normal study.  LOW RISK   right carpal tunnel surgery     right knee arthroscopy     right thumb surgery     fused   SHOULDER SURGERY Right    skin cancer spot removed a month ago     SVT ABLATION N/A 08/24/2020   Procedure: SVT ABLATION;  Surgeon: Marinus Maw, MD;  [facility];  Service: Cardiovascular;  Laterality: N/A;   TOTAL SHOULDER ARTHROPLASTY Right 01/22/2019   Procedure: TOTAL SHOULDER ARTHROPLASTY;  Surgeon: Beverely Low, MD;  Location: [facility];  Service: Orthopedics;  Laterality: Right;  interscalene block   TRANSTHORACIC ECHOCARDIOGRAM  05/19/2020   Heart rate was 130 bpm.  LVEF 60 to 65%.  Mild aortic valve calcification with no stenosis.  Normal atrial sizes.  Mildly elevated PAP.     There is no immunization history on file for this patient.  MEDICATIONS/ALLERGIES   No outpatient medications have been marked as taking for the 01/29/21 encounter (Appointment) with Marykay Lex, MD.    Allergies  Allergen Reactions   Tetracyclines & Related Anaphylaxis    SOCIAL HISTORY/FAMILY HISTORY   Reviewed in Epic:  Pertinent findings:  Social History   Tobacco Use   Smoking status: Former    Types: Cigarettes    Quit date: 2001    Years since quitting: 21.8   Smokeless tobacco: Former    Quit date: 01/13/2009   Tobacco comments:    quit smoking 26yrs ago  Vaping Use   Vaping Use: Never used  Substance Use Topics   Alcohol use: Yes    Comment: once a month   Drug use: No   Social History   Social History Narrative   Lifestyle:  married;   Residence:  lives with wife, and granddaughter; has 4 children with 12 grandchildren.   Diet:  regular diet; Exercise:  no regular exercise-enjoys doing yard work-just not doing much now because he has not felt like it.  Feeling so tired.   ;  Occupation:  disabled; Previously worked as an Geneticist, molecular.          OBJCTIVE -PE, EKG, labs   Wt Readings from Last 3 Encounters:  09/28/20 221 lb 9.6  oz (100.5 kg)  08/24/20 220 lb (99.8 kg)  07/28/20 218 lb 6.4 oz (99.1 kg)    Physical Exam: There were no vitals taken for this visit. Physical Exam   Adult ECG Report  Rate: *** ;  Rhythm: {rhythm:17366};   Narrative Interpretation: ***  Recent Labs:  ***  No results found for: CHOL, HDL, LDLCALC, LDLDIRECT, TRIG, CHOLHDL Lab Results  Component Value Date   CREATININE 0.91 07/28/2020   BUN 18 07/28/2020   NA 136 07/28/2020   K 4.6 07/28/2020   CL 101 07/28/2020   CO2 21 07/28/2020  CBC Latest Ref Rng & Units 07/28/2020 01/23/2019 01/14/2019  WBC 3.4 - 10.8 x10E3/uL 6.4 - 9.1  Hemoglobin 13.0 - 17.7 g/dL 41.5 12.3(L) 14.1  Hematocrit 37.5 - 51.0 % 40.1 38.8(L) 43.1  Platelets 150 - 450 x10E3/uL 211 - 219    Lab Results  Component Value Date   HGBA1C 5.8 (H) 01/14/2019   No results found for: TSH  ==================================================  COVID-19 Education: The signs and symptoms of COVID-19 were discussed with the patient and how to seek care for testing (follow up with PCP or arrange E-visit).    I spent a total of ***minutes with the patient spent in direct patient consultation.  Additional time spent with chart review  / charting (studies, outside notes, etc): *** min Total Time: *** min  Current medicines are reviewed at length with the patient today.  (+/- concerns) ***  This visit occurred during the SARS-CoV-2 public health emergency.  Safety protocols were in place, including screening questions prior to the visit, additional usage of staff PPE, and extensive cleaning of exam room while observing appropriate contact time as indicated for disinfecting solutions.  Notice: This dictation was prepared with Dragon dictation along with smart phrase technology. Any transcriptional errors that result from this process are unintentional and may not be corrected upon review.  Patient Instructions / Medication Changes & Studies & Tests Ordered   There are  no Patient Instructions on file for this visit.   Studies Ordered:   No orders of the defined types were placed in this encounter.    Bryan Lemma, M.D., M.S. Interventional Cardiologist   Pager # [phone] Phone # [phone] [address] [place], [place] [address]   Thank you for choosing [facility] at [facility]!!

## 2021-02-21 ENCOUNTER — Other Ambulatory Visit: Payer: Self-pay

## 2021-02-21 ENCOUNTER — Other Ambulatory Visit: Payer: Self-pay | Admitting: Family Medicine

## 2021-02-21 ENCOUNTER — Ambulatory Visit
Admission: RE | Admit: 2021-02-21 | Discharge: 2021-02-21 | Disposition: A | Discharge status: Home / Self Care | Payer: Medicare Other | Source: Ambulatory Visit | Attending: Family Medicine | Admitting: Family Medicine

## 2021-02-21 DIAGNOSIS — R058 Other specified cough: Secondary | ICD-10-CM

## 2021-04-18 ENCOUNTER — Ambulatory Visit: Payer: Medicare Other | Admitting: Cardiology

## 2022-01-04 ENCOUNTER — Other Ambulatory Visit: Payer: Self-pay

## 2022-01-04 MED ORDER — OZEMPIC (2 MG/DOSE) 8 MG/3ML ~~LOC~~ SOPN
2.0000 mg | PEN_INJECTOR | SUBCUTANEOUS | 2 refills | Status: DC
  Filled 2022-01-04 – 2022-01-11 (×2): qty 3, 28d supply, fill #0
  Filled 2022-02-08: qty 3, 28d supply, fill #1
  Filled 2022-03-20: qty 3, 28d supply, fill #2

## 2022-01-11 ENCOUNTER — Other Ambulatory Visit: Payer: Self-pay

## 2022-01-19 ENCOUNTER — Emergency Department (HOSPITAL_COMMUNITY)
Admission: EM | Admit: 2022-01-19 | Discharge: 2022-01-19 | Discharge status: Against Medical Advice | Payer: Medicare Other

## 2022-01-19 ENCOUNTER — Other Ambulatory Visit: Payer: Self-pay

## 2022-01-19 NOTE — ED Notes (Signed)
Pt isnt in the ED waiting room.

## 2022-02-08 ENCOUNTER — Other Ambulatory Visit: Payer: Self-pay

## 2022-03-20 ENCOUNTER — Other Ambulatory Visit: Payer: Self-pay

## 2022-03-21 ENCOUNTER — Other Ambulatory Visit: Payer: Self-pay

## 2022-04-12 ENCOUNTER — Other Ambulatory Visit: Payer: Self-pay

## 2022-04-15 ENCOUNTER — Other Ambulatory Visit: Payer: Self-pay

## 2022-04-15 MED ORDER — SEMAGLUTIDE (2 MG/DOSE) 8 MG/3ML ~~LOC~~ SOPN
2.0000 mg | PEN_INJECTOR | SUBCUTANEOUS | 0 refills | Status: DC
  Filled 2022-04-15: qty 3, 28d supply, fill #0

## 2022-04-18 ENCOUNTER — Other Ambulatory Visit: Payer: Self-pay | Admitting: Family Medicine

## 2022-04-18 ENCOUNTER — Ambulatory Visit
Admission: RE | Admit: 2022-04-18 | Discharge: 2022-04-18 | Disposition: A | Discharge status: Home / Self Care | Payer: Medicare Other | Source: Ambulatory Visit | Attending: Family Medicine | Admitting: Family Medicine

## 2022-04-18 DIAGNOSIS — R1030 Lower abdominal pain, unspecified: Secondary | ICD-10-CM

## 2022-04-24 ENCOUNTER — Other Ambulatory Visit: Payer: Self-pay

## 2022-04-30 ENCOUNTER — Other Ambulatory Visit: Payer: Self-pay

## 2022-05-10 ENCOUNTER — Other Ambulatory Visit: Payer: Self-pay

## 2022-10-15 ENCOUNTER — Other Ambulatory Visit: Payer: Self-pay | Admitting: Family Medicine

## 2022-10-15 ENCOUNTER — Ambulatory Visit: Admission: RE | Admit: 2022-10-15 | Payer: Medicare Other | Source: Ambulatory Visit

## 2022-10-15 DIAGNOSIS — J40 Bronchitis, not specified as acute or chronic: Secondary | ICD-10-CM

## 2023-01-21 ENCOUNTER — Ambulatory Visit (AMBULATORY_SURGERY_CENTER): Payer: Medicare Other

## 2023-01-21 VITALS — Ht 69.0 in | Wt 200.0 lb

## 2023-01-21 DIAGNOSIS — Z800 Family history of malignant neoplasm of digestive organs: Secondary | ICD-10-CM

## 2023-01-21 DIAGNOSIS — Z1211 Encounter for screening for malignant neoplasm of colon: Secondary | ICD-10-CM

## 2023-01-21 MED ORDER — PEG 3350-KCL-NA BICARB-NACL 420 G PO SOLR: 4000.0000 mL | Freq: Once | ORAL | 0 refills | Status: AC

## 2023-01-21 NOTE — Progress Notes (Signed)
Pre visit completed via phone call; Patient verified name, DOB, and address; No egg or soy allergy known to patient  No issues known to pt with past sedation with any surgeries or procedures Patient denies ever being told they had issues or difficulty with intubation  No FH of Malignant Hyperthermia Pt is not on diet pills Pt is not on home 02  Pt is not on blood thinners  Pt denies issues with constipation;  No A fib or A flutter Have any cardiac testing pending--NO Insurance verified during PV appt--- Medicare Pt can ambulate without assistance;  Pt denies use of chewing tobacco Discussed diabetic/weight loss medication holds; Discussed NSAID holds; Checked BMI to be less than 50; Pt instructed to use Single[employer]com or GoodRx for a price reduction on prep  Patient's chart reviewed by Cathlyn Parsons CNRA prior to previsit and patient appropriate for the [facility].  Pre visit completed and red dot placed by patient's name on their procedure day (on provider's schedule).    Instructions sent to MyChart per patient request;

## 2023-02-12 ENCOUNTER — Encounter: Payer: Self-pay | Admitting: Internal Medicine

## 2023-02-12 ENCOUNTER — Ambulatory Visit: Payer: Medicare Other | Admitting: Internal Medicine

## 2023-02-12 VITALS — BP 131/92 | HR 65 | Temp 98.0°F | Resp 15 | Ht 69.0 in | Wt 200.0 lb

## 2023-02-12 DIAGNOSIS — Z1211 Encounter for screening for malignant neoplasm of colon: Secondary | ICD-10-CM

## 2023-02-12 DIAGNOSIS — D123 Benign neoplasm of transverse colon: Secondary | ICD-10-CM

## 2023-02-12 DIAGNOSIS — D122 Benign neoplasm of ascending colon: Secondary | ICD-10-CM

## 2023-02-12 MED ORDER — SODIUM CHLORIDE 0.9 % IV SOLN: 500.0000 mL | Freq: Once | INTRAVENOUS | Status: DC

## 2023-02-12 NOTE — Op Note (Signed)
[facility] Patient Name: [name] Procedure Date: 02/12/2023 9:18 AM MRN: [id number] Endoscopist: Particia Lather , , [id number] Age: 67 Referring MD:  Date of Birth: June 21, 1955 Gender: Male Account #: [id number] Procedure:                Colonoscopy Indications:              Screening for colorectal malignant neoplasm Medicines:                Monitored Anesthesia Care Procedure:                Pre-Anesthesia Assessment:                           - Prior to the procedure, a History and Physical                            was performed, and patient medications and                            allergies were reviewed. The patient's tolerance of                            previous anesthesia was also reviewed. The risks                            and benefits of the procedure and the sedation                            options and risks were discussed with the patient.                            All questions were answered, and informed consent                            was obtained. Prior Anticoagulants: The patient has                            taken no anticoagulant or antiplatelet agents. ASA                            Grade Assessment: III - A patient with severe                            systemic disease. After reviewing the risks and                            benefits, the patient was deemed in satisfactory                            condition to undergo the procedure.                           After obtaining informed consent, the colonoscope  was passed under direct vision. Throughout the                            procedure, the patient's blood pressure, pulse, and                            oxygen saturations were monitored continuously. The                            CF [id number] #[id number] was introduced through the anus                            and advanced to the the terminal ileum. The                            colonoscopy was  performed without difficulty. The                            patient tolerated the procedure well. The quality                            of the bowel preparation was good. The terminal                            ileum, ileocecal valve, appendiceal orifice, and                            rectum were photographed. Scope In: 9:23:36 AM Scope Out: 9:46:13 AM Scope Withdrawal Time: 0 hours 16 minutes 25 seconds  Total Procedure Duration: 0 hours 22 minutes 37 seconds  Findings:                 The terminal ileum appeared normal.                           Three sessile polyps were found in the transverse                            colon and ascending colon. The polyps were 3 to 6                            mm in size. These polyps were removed with a cold                            snare. Resection and retrieval were complete.                           Multiple diverticula were found in the sigmoid                            colon and descending colon.                           Non-bleeding internal hemorrhoids were found during  retroflexion. Complications:            No immediate complications. Estimated Blood Loss:     Estimated blood loss was minimal. Impression:               - The examined portion of the ileum was normal.                           - Three 3 to 6 mm polyps in the transverse colon                            and in the ascending colon, removed with a cold                            snare. Resected and retrieved.                           - Diverticulosis in the sigmoid colon and in the                            descending colon.                           - Non-bleeding internal hemorrhoids. Recommendation:           - Discharge patient to home (with escort).                           - Await pathology results.                           - The findings and recommendations were discussed                            with the patient. Dr Particia Lather "Alan Ripper" Leonides Schanz,  02/12/2023 9:54:45 AM

## 2023-02-12 NOTE — Progress Notes (Signed)
Called to room to assist during endoscopic procedure.  Patient ID and intended procedure confirmed with present staff. Received instructions for my participation in the procedure from the performing physician.  

## 2023-02-12 NOTE — Progress Notes (Signed)
GASTROENTEROLOGY PROCEDURE H&P NOTE   Primary Care Physician: Theodis Shove, DO    Reason for Procedure:   Colon cancer screening  Plan:    Colonoscopy  Patient is appropriate for endoscopic procedure(s) in the ambulatory ([facility]) setting.  The nature of the procedure, as well as the risks, benefits, and alternatives were carefully and thoroughly reviewed with the patient. Ample time for discussion and questions allowed. The patient understood, was satisfied, and agreed to proceed.     HPI: [name] is a 67 y.o. male who presents for colonoscopy for colon cancer screening. Feels like his stools have been more loose but he on average has one BM every other day. Denies blood in the stools. Denies nocturnal stools. Denies unintentional weight loss. Denies family history of colon cancer. Last colonoscopy was 12 years ago that was reportedly normal.   Past Medical History:  Diagnosis Date   Aortic atherosclerosis (HCC) 01/06/2018   Noted on CT scan   Arthritis    generalized   Carpal tunnel syndrome    left hand   Cataract    not a surgical candidate at this time; (01/21/2023   Chronic back pain    Depression    takes Paxil daily   Diabetes mellitus without complication (HCC)    Now on glipizide   Diverticulosis    Gastric ulcer    History of colon polyps    History of gout    History of staph infection 1yrs ago   Hyperlipidemia    takes Pravastatin daily   Hypertension    takes Prinzide daily   Obesity (BMI 30-39.9) 01/06/2018   Pain in joint of right shoulder 08/06/2017   Pneumonia 2012   Sleep apnea    sleep study in epic from 2010;doesn't use CPAP (01/21/2023)   SVT (supraventricular tachycardia) (HCC)    hx of    Past Surgical History:  Procedure Laterality Date   ANTERIOR CERVICAL DECOMP/DISCECTOMY FUSION N/A 11/25/2012   Procedure: ANTERIOR CERVICAL DECOMPRESSION/DISCECTOMY FUSION C4- C6 2 LEVEL;  Surgeon: Venita Lick, MD;  Location: [facility];   Service: Orthopedics;  Laterality: N/A;   APPENDECTOMY     BACK SURGERY     COLONOSCOPY     ESOPHAGOGASTRODUODENOSCOPY     EVENT MONITOR  07/04/2020   Predominant rhythm: Sinus -> min 43 bpm, max 139 bpm, avg 79 bpm. 122 episodes of SVT :fastest ~60 seconds - 174 bpm, longest 1:36 hr - ~124 bpm.  Multiple episodes lasted less than 1 minute.  1 pause lasting 5.4 seconds occurred likely due to high-grade AV block.  (P waves 1).-4:30 AM -> symptoms noted with SVT.  Not with pause.  Very rare PVCs.   left finger surgery     NM MYOVIEW LTD  10/09/2017   For chest pain:EKG normal, images no evidence of ischemia or infarction EF 67%.  Reduced exercise tolerance.   NM MYOVIEW LTD  06/12/2020   Lexiscan: Normal EF 60%.  No ischemia or infarction.  Normal study.  LOW RISK   right carpal tunnel surgery     right knee arthroscopy     right thumb surgery     fused   SHOULDER SURGERY Right    skin cancer spot removed a month ago     SVT ABLATION N/A 08/24/2020   Procedure: SVT ABLATION;  Surgeon: Marinus Maw, MD;  [facility];  Service: Cardiovascular;  Laterality: N/A;   TOTAL SHOULDER ARTHROPLASTY Right 01/22/2019  Procedure: TOTAL SHOULDER ARTHROPLASTY;  Surgeon: Beverely Low, MD;  Location: [facility];  Service: Orthopedics;  Laterality: Right;  interscalene block   TRANSTHORACIC ECHOCARDIOGRAM  05/19/2020   Heart rate was 130 bpm.  LVEF 60 to 65%.  Mild aortic valve calcification with no stenosis.  Normal atrial sizes.  Mildly elevated PAP.    Prior to Admission medications   Medication Sig Start Date End Date Taking? Authorizing Provider  glipiZIDE (GLUCOTROL XL) 10 MG 24 hr tablet Take 10 mg by mouth in the morning and at bedtime.   Yes [provider]  lisinopril-hydrochlorothiazide (PRINZIDE,ZESTORETIC) 20-25 MG tablet Take 0.5 tablets by mouth in the morning.   Yes [provider]  metFORMIN (GLUCOPHAGE) 1000 MG tablet Take 1,000 mg by mouth 2 (two) times  daily with a meal.   Yes [provider]  Cholecalciferol (VITAMIN D-3) 125 MCG (5000 UT) TABS Take 5,000 Units by mouth in the morning.    [provider]  simvastatin (ZOCOR) 20 MG tablet Take 20 mg by mouth in the morning.    [provider]  vitamin C (ASCORBIC ACID) 500 MG tablet Take 500 mg by mouth in the morning.    [provider]    Current Outpatient Medications  Medication Sig Dispense Refill   glipiZIDE (GLUCOTROL XL) 10 MG 24 hr tablet Take 10 mg by mouth in the morning and at bedtime.     lisinopril-hydrochlorothiazide (PRINZIDE,ZESTORETIC) 20-25 MG tablet Take 0.5 tablets by mouth in the morning.     metFORMIN (GLUCOPHAGE) 1000 MG tablet Take 1,000 mg by mouth 2 (two) times daily with a meal.     Cholecalciferol (VITAMIN D-3) 125 MCG (5000 UT) TABS Take 5,000 Units by mouth in the morning.     simvastatin (ZOCOR) 20 MG tablet Take 20 mg by mouth in the morning.     vitamin C (ASCORBIC ACID) 500 MG tablet Take 500 mg by mouth in the morning.     Current Facility-Administered Medications  Medication Dose Route Frequency Provider Last Rate Last Admin   0.9 %  sodium chloride infusion  500 mL Intravenous Once Imogene Burn, MD        Allergies as of 02/12/2023 - Review Complete 02/12/2023  Allergen Reaction Noted   Tetracyclines & related Anaphylaxis 11/20/2012    Family History  Problem Relation Age of Onset   Stomach cancer Mother 60   Heart disease Mother        Pacemaker   Heart disease Sister        He is not sure the details   COPD Brother    Colon cancer Neg Hx    Colon polyps Neg Hx    Esophageal cancer Neg Hx    Rectal cancer Neg Hx     Social History   Socioeconomic History   Marital status: Married    Spouse name: Not on file   Number of children: Not on file   Years of education: Not on file   Highest education level: 11th grade  Occupational History   Occupation: Disabled  Tobacco Use   Smoking status:  Former    Current packs/day: 0.00    Types: Cigarettes    Quit date: 2001    Years since quitting: 23.9   Smokeless tobacco: Former    Quit date: 01/13/2009   Tobacco comments:    quit smoking 66yrs ago  Vaping Use   Vaping status: Never Used  Substance and Sexual Activity  Alcohol use: Yes    Comment: once a month   Drug use: No   Sexual activity: Yes  Other Topics Concern   Not on file  Social History Narrative   Lifestyle:  married;   Residence:  lives with wife, and granddaughter; has 4 children with 12 grandchildren.   Diet:  regular diet; Exercise:  no regular exercise-enjoys doing yard work-just not doing much now because he has not felt like it.  Feeling so tired.   ;  Occupation:  disabled; Previously worked as an Geneticist, molecular.         Social Determinants of Health   Financial Resource Strain: Not on file  Food Insecurity: Not on file  Transportation Needs: Not on file  Physical Activity: Not on file  Stress: Not on file  Social Connections: Unknown (08/06/2021)   Received from [facility]   Social Network    Social Network: Not on file  Intimate Partner Violence: Unknown (06/28/2021)   Received from [facility]   HITS    Physically Hurt: Not on file    Insult or Talk Down To: Not on file    Threaten Physical Harm: Not on file    Scream or Curse: Not on file    Physical Exam: Vital signs in last 24 hours: BP 133/66   Pulse 67   Temp 98 F (36.7 C)   Ht 5\' 9"  (1.753 m)   Wt 200 lb (90.7 kg)   SpO2 97%   BMI 29.53 kg/m  GEN: NAD EYE: Sclerae anicteric ENT: MMM CV: Non-tachycardic Pulm: No increased work of breathing GI: Soft, NT/ND NEURO:  Alert & Oriented   Eulah Pont, MD [facility]  02/12/2023 9:12 AM

## 2023-02-12 NOTE — Progress Notes (Signed)
Pt's states no medical or surgical changes since previsit or office visit. 

## 2023-02-12 NOTE — Patient Instructions (Addendum)
Discharge patient to home (with escort).                           - Await pathology results.                           - The findings and recommendations were discussed                            with the patient. Handout on polyps and diverticulosis given.    YOU HAD AN ENDOSCOPIC PROCEDURE TODAY AT THE [facility]:   Refer to the procedure report that was given to you for any specific questions about what was found during the examination.  If the procedure report does not answer your questions, please call your gastroenterologist to clarify.  If you requested that your care partner not be given the details of your procedure findings, then the procedure report has been included in a sealed envelope for you to review at your convenience later.  YOU SHOULD EXPECT: Some feelings of bloating in the abdomen. Passage of more gas than usual.  Walking can help get rid of the air that was put into your GI tract during the procedure and reduce the bloating. If you had a lower endoscopy (such as a colonoscopy or flexible sigmoidoscopy) you may notice spotting of blood in your stool or on the toilet paper. If you underwent a bowel prep for your procedure, you may not have a normal bowel movement for a few days.  Please Note:  You might notice some irritation and congestion in your nose or some drainage.  This is from the oxygen used during your procedure.  There is no need for concern and it should clear up in a day or so.  SYMPTOMS TO REPORT IMMEDIATELY:  Following lower endoscopy (colonoscopy or flexible sigmoidoscopy):  Excessive amounts of blood in the stool  Significant tenderness or worsening of abdominal pains  Swelling of the abdomen that is new, acute  Fever of 100F or higher   For urgent or emergent issues, a gastroenterologist can be reached at any hour by calling [phone]. Do not use MyChart messaging for urgent concerns.    DIET:  We do recommend a small meal at  first, but then you may proceed to your regular diet.  Drink plenty of fluids but you should avoid alcoholic beverages for 24 hours.  ACTIVITY:  You should plan to take it easy for the rest of today and you should NOT DRIVE or use heavy machinery until tomorrow (because of the sedation medicines used during the test).    FOLLOW UP: Our staff will call the number listed on your records the next business day following your procedure.  We will call around 7:15- 8:00 am to check on you and address any questions or concerns that you may have regarding the information given to you following your procedure. If we do not reach you, we will leave a message.     If any biopsies were taken you will be contacted by phone or by letter within the next 1-3 weeks.  Please call us at [phone] if you have not heard about the biopsies in 3 weeks.    SIGNATURES/CONFIDENTIALITY: You and/or your care partner have signed paperwork which will be entered into your electronic medical record.  These signatures attest to  the fact that that the information above on your After Visit Summary has been reviewed and is understood.  Full responsibility of the confidentiality of this discharge information lies with you and/or your care-partner.

## 2023-02-12 NOTE — Progress Notes (Signed)
Vss nad trans to pacu 

## 2023-02-13 ENCOUNTER — Telehealth: Payer: Self-pay | Admitting: *Deleted

## 2023-02-13 NOTE — Telephone Encounter (Signed)
  Follow up Call-     02/12/2023    8:32 AM  Call back number  Post procedure Call Back phone  # [phone], spouse number  Permission to leave phone message Yes     Patient questions:  Do you have a fever, pain , or abdominal swelling? No. Pain Score  0 *  Have you tolerated food without any problems? Yes.    Have you been able to return to your normal activities? Yes.    Do you have any questions about your discharge instructions: Diet   No. Medications  No. Follow up visit  No.  Do you have questions or concerns about your Care? No.  Actions: * If pain score is 4 or above: No action needed, pain <4.

## 2023-02-14 ENCOUNTER — Encounter: Payer: Self-pay | Admitting: Internal Medicine

## 2023-02-14 LAB — SURGICAL PATHOLOGY

## 2023-04-01 DIAGNOSIS — E1169 Type 2 diabetes mellitus with other specified complication: Secondary | ICD-10-CM | POA: Diagnosis not present

## 2023-05-19 DIAGNOSIS — R053 Chronic cough: Secondary | ICD-10-CM | POA: Diagnosis not present

## 2023-05-19 DIAGNOSIS — Z7984 Long term (current) use of oral hypoglycemic drugs: Secondary | ICD-10-CM | POA: Diagnosis not present

## 2023-05-19 DIAGNOSIS — E1169 Type 2 diabetes mellitus with other specified complication: Secondary | ICD-10-CM | POA: Diagnosis not present

## 2023-05-19 DIAGNOSIS — E119 Type 2 diabetes mellitus without complications: Secondary | ICD-10-CM | POA: Diagnosis not present
